# Patient Record
Sex: Female | Born: 1945
Health system: Southern US, Community
[De-identification: ages and names within clinical notes are randomized; demographics above are authoritative.]

## PROBLEM LIST (undated history)

## (undated) DIAGNOSIS — I2699 Other pulmonary embolism without acute cor pulmonale: Secondary | ICD-10-CM

## (undated) DIAGNOSIS — E785 Hyperlipidemia, unspecified: Secondary | ICD-10-CM

## (undated) DIAGNOSIS — T7840XA Allergy, unspecified, initial encounter: Secondary | ICD-10-CM

## (undated) DIAGNOSIS — D689 Coagulation defect, unspecified: Secondary | ICD-10-CM

## (undated) DIAGNOSIS — I10 Essential (primary) hypertension: Secondary | ICD-10-CM

## (undated) HISTORY — DX: Hyperlipidemia, unspecified: E78.5

## (undated) HISTORY — PX: ABDOMINAL HYSTERECTOMY: SHX81

## (undated) HISTORY — DX: Essential (primary) hypertension: I10

## (undated) HISTORY — DX: Other pulmonary embolism without acute cor pulmonale: I26.99

## (undated) HISTORY — DX: Allergy, unspecified, initial encounter: T78.40XA

## (undated) HISTORY — DX: Coagulation defect, unspecified: D68.9

---

## 2014-01-18 HISTORY — PX: BREAST BIOPSY: SHX20

## 2018-08-16 ENCOUNTER — Ambulatory Visit: Payer: Self-pay | Admitting: Family Medicine

## 2018-08-17 ENCOUNTER — Encounter: Payer: Self-pay | Admitting: Family Medicine

## 2018-08-17 ENCOUNTER — Other Ambulatory Visit: Payer: Self-pay

## 2018-08-17 ENCOUNTER — Ambulatory Visit (INDEPENDENT_AMBULATORY_CARE_PROVIDER_SITE_OTHER): Payer: Medicare Other | Admitting: Family Medicine

## 2018-08-17 VITALS — BP 145/88 | HR 95 | Temp 98.4°F | Ht 66.5 in | Wt 210.0 lb

## 2018-08-17 DIAGNOSIS — E782 Mixed hyperlipidemia: Secondary | ICD-10-CM | POA: Diagnosis not present

## 2018-08-17 DIAGNOSIS — Z0000 Encounter for general adult medical examination without abnormal findings: Secondary | ICD-10-CM

## 2018-08-17 DIAGNOSIS — F17200 Nicotine dependence, unspecified, uncomplicated: Secondary | ICD-10-CM

## 2018-08-17 DIAGNOSIS — K118 Other diseases of salivary glands: Secondary | ICD-10-CM

## 2018-08-17 DIAGNOSIS — E785 Hyperlipidemia, unspecified: Secondary | ICD-10-CM

## 2018-08-17 DIAGNOSIS — M720 Palmar fascial fibromatosis [Dupuytren]: Secondary | ICD-10-CM

## 2018-08-17 DIAGNOSIS — I10 Essential (primary) hypertension: Secondary | ICD-10-CM

## 2018-08-17 NOTE — Patient Instructions (Addendum)
It was a pleasure meeting you!  1. We took some blood for routine lab work. If anything abnormal returns, we will call you.  2. I have referred you to a hand surgeon for the dupuytren's contracture. They will call you to schedule an appointment.  3. Follow up in 1 month to recheck blood pressure.  Be Well!  Dr. Chauncey Reading Dupuytren's Contracture Dupuytren's contracture is a condition in which tissue under the skin of the palm becomes thick. This causes one or more of the fingers to curl inward (contract) toward the palm. After a while, the fingers may not be able to straighten out. This condition affects some or all of the fingers and the palm of the hand. This condition may affect one or both hands. Dupuytren's contracture is a long-term (chronic) condition that develops (progresses) slowly over time. There is no cure, but symptoms can be managed and progression can be slowed with treatment. This condition is usually not dangerous or painful, but it can interfere with everyday tasks. What are the causes?  This condition is caused by tissue (fascia) in the palm that gets thicker and tighter. When the fascia thickens, it pulls on the cords of tissue (tendons) that control finger movement. This causes the fingers to contract. The cause of fascia thickening is not known. However, the condition is often passed along from parent to child (inherited). What increases the risk? The following factors may make you more likely to develop this condition:  Being 69 years of age or older.  Being female.  Having a family history of this condition.  Using tobacco products, including cigarettes, chewing tobacco, and e-cigarettes.  Drinking alcohol excessively.  Having diabetes.  Having a seizure disorder. What are the signs or symptoms? Early symptoms of this condition may include:  Thick, puckered skin on the hand.  One or more lumps (nodules) on the palm. Nodules may be tender when they first  appear, but they are generally painless. Later symptoms of this condition may include:  Thick cords of tissue in the palm.  Fingers curled up toward the palm.  Inability to straighten the fingers into their normal position. Though this condition is usually painless, you may have discomfort when holding or grabbing objects. How is this diagnosed? This condition is diagnosed with a physical exam, which may include:  Looking at your hands and feeling your palms. This is to check for thickened fascia and nodules.  Measuring finger motion.  Doing the [place] tabletop test. You may be asked to try to put your hand on a surface, with your palm down and your fingers straight out. How is this treated? There is no cure for this condition, but treatment can relieve discomfort and make symptoms more manageable. Treatment options may include:  Physical therapy. This can strengthen your hand and increase flexibility.  Occupational therapy. This can help you with everyday tasks that may be more difficult because of your condition.  Shots (injections). Substances may be injected into your hand, such as: ? Medicines that help to decrease swelling (corticosteroids). ? Proteins (collagenase) to weaken thick tissue. After a collagenase injection, your health care provider may stretch your fingers.  Needle aponeurotomy. A needle is pushed through the skin and into the fascia. Moving the needle against the fascia can weaken or break up the thick tissue.  Surgery. This may be needed if your condition causes discomfort or interferes with everyday activities. Physical therapy is usually needed after surgery. No treatment is guaranteed to cure  this condition. Recurrence of symptoms is common. Follow these instructions at home: Hand care  Take these actions to help protect your hand from possible injury: ? Use tools that have padded grips. ? Wear protective gloves while you work with your hands. ? Avoid  repetitive hand movements. General instructions  Take over-the-counter and prescription medicines only as told by your health care provider.  Manage any other conditions that you have, such as diabetes.  If physical therapy was prescribed, do exercises as told by your health care provider.  Do not use any products that contain nicotine or tobacco, such as cigarettes, e-cigarettes, and chewing tobacco. If you need help quitting, ask your health care provider.  If you drink alcohol: ? Limit how much you use to:  0-1 drink a day for women.  0-2 drinks a day for men. ? Be aware of how much alcohol is in your drink. In the [place] one drink equals one 12 oz bottle of beer (355 mL), one 5 oz glass of wine (148 mL), or one 1 oz glass of hard liquor (44 mL).  Keep all follow-up visits as told by your health care provider. This is important. Contact a health care provider if:  You develop new symptoms, or your symptoms get worse.  You have pain that gets worse or does not get better with medicine.  You have difficulty or discomfort with everyday tasks.  You develop numbness or tingling. Get help right away if:  You have severe pain.  Your fingers change color or become unusually cold. Summary  Dupuytren's contracture is a condition in which tissue under the skin of the palm becomes thick.  This condition is caused by tissue (fascia) that thickens. When it thickens, it pulls on the cords of tissue (tendons) that control finger movement and makes the fingers to contract.  You are more likely to develop this condition if you are a man, are over 73 years of age, have a family history of the condition, and drink a lot of alcohol.  This condition can be treated with physical and occupational therapy, injections, and surgery.  Follow instructions about how to care for your hand. Get help right away if you have severe pain or your fingers change color or become cold. This information is  not intended to replace advice given to you by your health care provider. Make sure you discuss any questions you have with your health care provider. Document Released: 11/01/2008 Document Revised: 07/26/2017 Document Reviewed: 07/26/2017 Elsevier Patient Education  2020 [employer]

## 2018-08-17 NOTE — Progress Notes (Signed)
Subjective:    Patient ID: Sandra Hernandez, female    DOB: [birth year], 73 y.o.   MRN: [id number]   CC: here to establish care  HPI: Sandra Hernandez is a 73 yo woman w/ PMH of PE, HLD and dupuytren's contracture. She presents today to establish care. She moved to [place] recently to be with her fiance. She formerly lived in [place] and worked for [employer], but is now retired.   Pt currently smokes 1/3 of a pack of cigarettes per day. She has gone through various periods of quitting and starting and estimates that her total smoking history is about 10 years (3 pack-years). She is not yet ready to try quitting again.  Pt experienced a PE in the 1970s secondary to OCPs. She was treated in hospital with a blood thinner, followed by 6 mo of coumadin. She is now postmenopausal, takes no hormones, and has had a total hysterectomy.   Family hx is most significant for Sandra Hernandez in her 56s. No strong hx of HTN, DM, or heart disease. Pt had "toxaemia" or eclampsia, during 2 of her pregnancies, which resulted in stillbirth and miscarriage.  Since the pandemic began and pt moved to [facility] to be with her fiance, she has decreased exercise. Her most significant exercise is mowing the lawn once a week. Her fiance is a good cook and makes macaroni and meat products mostly. She has gained 15 lbs since moving to [place] and decreasing her weekly walking.    ROS: pertinent noted in the HPI    Past medical history, surgical, family, and social history reviewed and updated in the EMR as appropriate.  Objective:  BP (!) 145/88   Pulse 95   Temp 98.4 F (36.9 C) (Oral)   Ht 5' 6.5" (1.689 m)   Wt 210 lb (95.3 kg)   SpO2 99%   BMI 33.39 kg/m   Vitals and nursing note reviewed  General: NAD, pleasant, able to participate in exam HEENT: normocephalic, atraumatic, PERRLA, EOMI. Firm, smooth, bilateral submandibular nodules approx 1 cm in size Cardiac: RRR, S1 S2 present.  normal heart sounds, no murmurs. Respiratory: CTAB, normal effort, No wheezes, rales or rhonchi Extremities: no edema or cyanosis. Dupuyten's contracture grade 2 present along Left 5th metacarpal, grade 1 along Right 5th metacarpal Skin: warm and dry, healing excoriations and hyperpigmentation present along shoulder girdle Neuro: alert, no obvious focal deficits Psych: Normal affect and mood   Assessment & Plan:  [name] is a 73 yo woman w/ PMH of hyperlipidemia, PE and dupuytren's contracture.  HLD (hyperlipidemia) Pt has historically been on Simvastatin, unsure what dosage - Lipid panel collected today - Discussed exercise and diet habits. Pt was walking 3 miles about 3x per week and had lost 15 lbs. She believes she can begin this walking regimen again. - Follow up in 1 month unless results indicate an earlier return visit  Hypertension Pt 140s/80s in office today with repeat measurements - Pt treated for HTN in past but stopped medication due to resolution of elevated BP - History of eclampsia raises risk for HTN - BMP collected today to assess kidney function   Dupuytren contracture Stable, present for many years, L hand grade 2, R hand grade 1 - referral to hand surgery for evaluation and treatment - will check A1c at next visit due to correlation of Dupuytren's and diabetes  Tobacco use disorder Pt is aware of risks of smoking including heart problems, lung problems,  and cancer - She is not quite ready to quit - Educated patient that she is welcome to discuss quitting with me when she is ready  Other diseases of salivary glands - bilateral, symmetric, smoothly enlarged submandibular salivary glands present for many years - will monitor with physical exam    Shirlean Mylaraitlin Taiden [place]bourn, MD [facility] PGY-1

## 2018-08-18 ENCOUNTER — Encounter: Payer: Self-pay | Admitting: Family Medicine

## 2018-08-18 DIAGNOSIS — K118 Other diseases of salivary glands: Secondary | ICD-10-CM | POA: Insufficient documentation

## 2018-08-18 DIAGNOSIS — F17200 Nicotine dependence, unspecified, uncomplicated: Secondary | ICD-10-CM | POA: Insufficient documentation

## 2018-08-18 DIAGNOSIS — I10 Essential (primary) hypertension: Secondary | ICD-10-CM | POA: Insufficient documentation

## 2018-08-18 DIAGNOSIS — M720 Palmar fascial fibromatosis [Dupuytren]: Secondary | ICD-10-CM | POA: Insufficient documentation

## 2018-08-18 DIAGNOSIS — E785 Hyperlipidemia, unspecified: Secondary | ICD-10-CM | POA: Insufficient documentation

## 2018-08-18 LAB — BASIC METABOLIC PANEL
BUN/Creatinine Ratio: 8 — ABNORMAL LOW (ref 12–28)
BUN: 9 mg/dL (ref 8–27)
CO2: 21 mmol/L (ref 20–29)
Calcium: 9.9 mg/dL (ref 8.7–10.3)
Chloride: 103 mmol/L (ref 96–106)
Creatinine, Ser: 1.06 mg/dL — ABNORMAL HIGH (ref 0.57–1.00)
GFR calc Af Amer: 60 mL/min/{1.73_m2} (ref >59)
GFR calc non Af Amer: 52 mL/min/{1.73_m2} — ABNORMAL LOW (ref >59)
Glucose: 97 mg/dL (ref 65–99)
Potassium: 4.1 mmol/L (ref 3.5–5.2)
Sodium: 141 mmol/L (ref 134–144)

## 2018-08-18 LAB — CBC WITH DIFFERENTIAL/PLATELET
Basophils Absolute: 0 10*3/uL (ref 0.0–0.2)
Basos: 1 %
EOS (ABSOLUTE): 0.2 10*3/uL (ref 0.0–0.4)
Eos: 3 %
Hematocrit: 39.9 % (ref 34.0–46.6)
Hemoglobin: 13.7 g/dL (ref 11.1–15.9)
Immature Grans (Abs): 0 10*3/uL (ref 0.0–0.1)
Immature Granulocytes: 0 %
Lymphocytes Absolute: 2.3 10*3/uL (ref 0.7–3.1)
Lymphs: 40 %
MCH: 28.7 pg (ref 26.6–33.0)
MCHC: 34.3 g/dL (ref 31.5–35.7)
MCV: 84 fL (ref 79–97)
Monocytes Absolute: 0.4 10*3/uL (ref 0.1–0.9)
Monocytes: 6 %
Neutrophils Absolute: 2.9 10*3/uL (ref 1.4–7.0)
Neutrophils: 50 %
Platelets: 213 10*3/uL (ref 150–450)
RBC: 4.78 x10E6/uL (ref 3.77–5.28)
RDW: 13.6 % (ref 11.7–15.4)
WBC: 5.8 10*3/uL (ref 3.4–10.8)

## 2018-08-18 LAB — LIPID PANEL
Chol/HDL Ratio: 4.3 ratio (ref 0.0–4.4)
Cholesterol, Total: 204 mg/dL — ABNORMAL HIGH (ref 100–199)
HDL: 47 mg/dL (ref >39)
LDL Calculated: 132 mg/dL — ABNORMAL HIGH (ref 0–99)
Triglycerides: 124 mg/dL (ref 0–149)
VLDL Cholesterol Cal: 25 mg/dL (ref 5–40)

## 2018-08-18 NOTE — Assessment & Plan Note (Signed)
Pt is aware of risks of smoking including heart problems, lung problems, and cancer - She is not quite ready to quit - Educated patient that she is welcome to discuss quitting with me when she is ready

## 2018-08-18 NOTE — Assessment & Plan Note (Addendum)
Pt 140s/80s in office today with repeat measurements - Pt treated for HTN in past but stopped medication due to resolution of elevated BP - History of eclampsia raises risk for HTN - BMP collected today to assess kidney function

## 2018-08-18 NOTE — Assessment & Plan Note (Signed)
-   bilateral, symmetric, smoothly enlarged submandibular salivary glands present for many years - will monitor with physical exam

## 2018-08-18 NOTE — Assessment & Plan Note (Signed)
Stable, present for many years, L hand grade 2, R hand grade 1 - referral to hand surgery for evaluation and treatment - will check A1c at next visit due to correlation of Dupuytren's and diabetes

## 2018-08-18 NOTE — Assessment & Plan Note (Addendum)
Pt has historically been on Simvastatin, unsure what dosage - Lipid panel collected today - Discussed exercise and diet habits. Pt was walking 3 miles about 3x per week and had lost 15 lbs. She believes she can begin this walking regimen again. - Follow up in 1 month unless results indicate an earlier return visit

## 2018-09-27 ENCOUNTER — Telehealth: Payer: Self-pay

## 2018-09-27 DIAGNOSIS — L299 Pruritus, unspecified: Secondary | ICD-10-CM

## 2018-09-27 MED ORDER — HYDROXYZINE HCL 25 MG PO TABS: 25.0000 mg | ORAL_TABLET | Freq: Every evening | ORAL | 1 refills | Status: AC | PRN

## 2018-09-27 NOTE — Telephone Encounter (Signed)
Patient states that she has taken hydroxyzine for many years due to pruritus.  She has tried other medications such as first and second generation antihistamine blockers, without relief.  She only takes 1 at night as needed, she has never had any problems with becoming somnolent and falling.  I did warn her that this is possible to become a little sleepy and to take it only when she is in bed to avoid any falls.  Patient to follow-up on October 5 regarding high blood pressure, we only have one reading so far.  We will also discuss simvastatin and her LDL.  She states that she has not been regularly taking her simvastatin at night.  Gladys Damme, MD Family Medicine, PGY-1

## 2018-09-27 NOTE — Telephone Encounter (Signed)
Patient calls nurse line requesting a refill on hydroxyzine called into [facility]. I did not see this on med list. Please advise.

## 2018-10-23 ENCOUNTER — Ambulatory Visit: Payer: Medicare Other | Admitting: Family Medicine

## 2018-11-08 ENCOUNTER — Other Ambulatory Visit: Payer: Self-pay

## 2018-11-09 ENCOUNTER — Ambulatory Visit: Payer: Medicare Other | Admitting: Family Medicine

## 2018-11-09 ENCOUNTER — Other Ambulatory Visit: Payer: Self-pay

## 2018-11-15 ENCOUNTER — Encounter: Payer: Self-pay | Admitting: Family Medicine

## 2018-11-15 ENCOUNTER — Other Ambulatory Visit: Payer: Self-pay

## 2018-11-15 ENCOUNTER — Ambulatory Visit: Payer: Medicare Other | Admitting: Family Medicine

## 2018-11-15 VITALS — BP 128/74 | HR 72

## 2018-11-15 DIAGNOSIS — E785 Hyperlipidemia, unspecified: Secondary | ICD-10-CM | POA: Diagnosis not present

## 2018-11-15 DIAGNOSIS — Z23 Encounter for immunization: Secondary | ICD-10-CM

## 2018-11-15 DIAGNOSIS — L299 Pruritus, unspecified: Secondary | ICD-10-CM | POA: Diagnosis not present

## 2018-11-15 DIAGNOSIS — Z1159 Encounter for screening for other viral diseases: Secondary | ICD-10-CM | POA: Diagnosis not present

## 2018-11-15 DIAGNOSIS — I10 Essential (primary) hypertension: Secondary | ICD-10-CM

## 2018-11-15 MED ORDER — HYDROXYZINE HCL 25 MG PO TABS: 25.0000 mg | ORAL_TABLET | Freq: Every day | ORAL | 11 refills | Status: DC

## 2018-11-15 MED ORDER — SIMVASTATIN 10 MG PO TABS: 10.0000 mg | ORAL_TABLET | Freq: Every day | ORAL | 3 refills | Status: DC

## 2018-11-15 MED ORDER — BOOSTRIX 5-2.5-18.5 LF-MCG/0.5 IM SUSP: 0.5000 mL | Freq: Once | INTRAMUSCULAR | 0 refills | Status: AC

## 2018-11-15 NOTE — Patient Instructions (Addendum)
It was a pleasure seeing you today!  I have refilled your simvastatin and hydroxyzine today. We will recheck your cholesterol and kidney levels at your next visit in January 2021.   With hydroxyzine in people your age you must watch out for falls, and changes in cognition. Since you've used this for a long time and it's a low dose, this plan is acceptable for now.   Follow up in 3 months and we will check your lipid panel, liver function, and kidney function.  Be well!  Dr. Chauncey Reading

## 2018-11-15 NOTE — Assessment & Plan Note (Signed)
Refill simvastatin. Pt now regularly taking it. Lipid panel with elevated total cholesterol and LDL in July. Will recheck lipid panel and LFTs in 3 months at next visit.

## 2018-11-15 NOTE — Progress Notes (Signed)
   Subjective:    Patient ID: Sandra Hernandez, female    DOB: 11/20/1945, 73 y.o.   MRN: [id number]   CC: medication refill  HPI: Pt reports that she is overall feeling well and no concerns. She has been attempting to continue her walking regimen. She has been taking simvastatin every night without problem, no increased itching.  Pt inquires about creatinine level and concern for elevated number on laboratory work.   She believes she has been screened for Hep C in the past and will check her records for this.  Smoking status reviewed   ROS: pertinent noted in the HPI   Past medical history, surgical, family, and social history reviewed and updated in the EMR as appropriate.  Objective:  BP 128/74   Pulse 72   SpO2 98%   Vitals and nursing note reviewed  General: NAD, pleasant, able to participate in exam Cardiac: RRR, S1 S2 present. normal heart sounds, no murmurs. Respiratory: CTAB, normal effort, No wheezes, rales or rhonchi Extremities: no edema or cyanosis. Skin: warm and dry, no rashes noted Neuro: alert, no obvious focal deficits Psych: Normal affect and mood  Assessment & Plan:  Sandra Hernandez is a 73 yo woman with HLD, HTN, and pruritis presenting for medication refill.  HLD (hyperlipidemia) Refill simvastatin. Pt now regularly taking it. Lipid panel with elevated total cholesterol and LDL in July. Will recheck lipid panel and LFTs in 3 months at next visit.  Hypertension BP at goal today, does not require medication at this time. Will continue to follow. Pt concerned about cr of 1.06, discussed with her that this is within the range of acceptable increase for someone in her age demographic and we will continue to monitor over time.  - Follow up in 3 months and check BP and BMP for cr function  Pruritus Pt has longstanding history of pruritis. She has used low-dose hydroxyzine for many years, which helps her. Discussed the risk of using this drug in her age group  and concerns such as falling and dementia. Pt is aware of risks, due to low dose and pt's comfort with this particular drug will continue to prescribe for now. -check LFTs at next visit  Need for diphtheria-tetanus-pertussis (Tdap) vaccine Prescription sent to pharmacy.  Influenza vaccine administered Administered today.  Gladys Damme, MD [place] PGY-1

## 2018-11-16 ENCOUNTER — Encounter: Payer: Self-pay | Admitting: Family Medicine

## 2018-11-16 DIAGNOSIS — L299 Pruritus, unspecified: Secondary | ICD-10-CM | POA: Insufficient documentation

## 2018-11-16 DIAGNOSIS — Z23 Encounter for immunization: Secondary | ICD-10-CM | POA: Insufficient documentation

## 2018-11-16 NOTE — Assessment & Plan Note (Signed)
Administered today.

## 2018-11-16 NOTE — Assessment & Plan Note (Signed)
BP at goal today, does not require medication at this time. Will continue to follow. Pt concerned about cr of 1.06, discussed with her that this is within the range of acceptable increase for someone in her age demographic and we will continue to monitor over time.  - Follow up in 3 months and check BP and BMP for cr function

## 2018-11-16 NOTE — Assessment & Plan Note (Signed)
Pt has longstanding history of pruritis. She has used low-dose hydroxyzine for many years, which helps her. Discussed the risk of using this drug in her age group and concerns such as falling and dementia. Pt is aware of risks, due to low dose and pt's comfort with this particular drug will continue to prescribe for now. -check LFTs at next visit

## 2018-11-16 NOTE — Assessment & Plan Note (Signed)
Prescription sent to pharmacy.

## 2018-12-05 ENCOUNTER — Other Ambulatory Visit: Payer: Self-pay | Admitting: Family Medicine

## 2018-12-05 DIAGNOSIS — L299 Pruritus, unspecified: Secondary | ICD-10-CM

## 2019-01-25 ENCOUNTER — Other Ambulatory Visit: Payer: Self-pay

## 2019-01-25 ENCOUNTER — Ambulatory Visit (INDEPENDENT_AMBULATORY_CARE_PROVIDER_SITE_OTHER): Payer: Medicare PPO | Admitting: Family Medicine

## 2019-01-25 VITALS — BP 158/82 | HR 71 | Wt 218.4 lb

## 2019-01-25 DIAGNOSIS — H6122 Impacted cerumen, left ear: Secondary | ICD-10-CM | POA: Diagnosis not present

## 2019-01-25 NOTE — Patient Instructions (Signed)
Thank you for coming to see me today. It was a pleasure. Today we talked about:   Your ears: You had wax and cotton in your left ear.  We were able to remove it.  You can also use nasal saline spray to help with the balance of air and you are sinuses and eustachian tubes.  If you do not have improvement in the fullness in your ear in the next week, please come back.  Please follow-up with your PCP in the next month, she would like to see you back for a follow-up appointment.  If you have any questions or concerns, please do not hesitate to call the office at [phone].  Best,   Luis Abed, DO

## 2019-01-25 NOTE — Progress Notes (Signed)
     Subjective: Chief Complaint  Patient presents with  . Ear Problem     HPI: Sandra Hernandez is a 74 y.o. presenting to clinic today to discuss the following:  1 Left ear fullness Patient reports that last week she started to notice some itching in her left ear canal.  States that she put hydrogen peroxide on the end of the Q-tip and then placed this in her ear.  Notes that since then, she has been having a fullness in her ear and feels like she cannot hear as well out of that ear.  Denies any true pain, congestion, fevers.  Denies any drainage from the ear.  States that she has had to come in for wax cleanouts previously.      ROS noted in HPI. Chief complaint noted.  Other Pertinent PMH: Hypertension, hyperlipidemia Past Medical, Surgical, Social, and Family History Reviewed & Updated per EMR.      Social History   Tobacco Use  Smoking Status Current Every Day Smoker  . Packs/day: 0.33  . Years: 10.00  . Pack years: 3.30  . Types: Cigarettes  Smokeless Tobacco Never Used   Smoking status noted.    Objective: BP (!) 158/82   Pulse 71   Wt 218 lb 6.4 oz (99.1 kg)   SpO2 97%   BMI 34.72 kg/m  Vitals and nursing notes reviewed  Physical Exam:  General: 74 y.o. female in NAD HEENT: Right ear canal clear with TM intact, Left ear canal with large amount of wax with what appears to be white cotton attached to it, obscuring view of left tympanic membrane. Lungs: Breathing comfortably on room air Extremities: Ambulating without difficulty  Ear washout performed in clinic HEENT: Left ear canal clear after cleanout with tympanic membrane intact and non-erythematous  No results found for this or any previous visit (from the past 72 hour(s)).  Assessment/Plan:  Impacted cerumen of left ear Left ear with impacted cerumen, small amount of Q-tip in the ear.  Advised patient to no longer use Q-tips to clean her ears.  Advised to use a warm wet washcloth to clean the  outside of her ear and to come in for cleanings deeper inside.  Patient noted some mild improvement in her symptoms with the removal of this, but still felt a small amount of fullness.  Advised her to use nasal saline spray to help reset balance of her eustachian tube.  She voiced understanding of this.  Advised if no improvement within 1 week to return to care.     PATIENT EDUCATION PROVIDED: See AVS    Diagnosis and plan along with any newly prescribed medication(s) were discussed in detail with this patient today. The patient verbalized understanding and agreed with the plan. Patient advised if symptoms worsen return to clinic or ER.   Health Maintainance: PCP wanted to follow up with patient in 3 months from visit in October 2020, advised to make appointment with PCP.   No orders of the defined types were placed in this encounter.   No orders of the defined types were placed in this encounter.    Luis Abed, DO 01/26/2019, 10:15 AM PGY-2 [facility]

## 2019-01-26 DIAGNOSIS — H6122 Impacted cerumen, left ear: Secondary | ICD-10-CM | POA: Insufficient documentation

## 2019-01-26 NOTE — Assessment & Plan Note (Signed)
Left ear with impacted cerumen, small amount of Q-tip in the ear.  Advised patient to no longer use Q-tips to clean her ears.  Advised to use a warm wet washcloth to clean the outside of her ear and to come in for cleanings deeper inside.  Patient noted some mild improvement in her symptoms with the removal of this, but still felt a small amount of fullness.  Advised her to use nasal saline spray to help reset balance of her eustachian tube.  She voiced understanding of this.  Advised if no improvement within 1 week to return to care.

## 2019-09-05 ENCOUNTER — Encounter: Payer: Self-pay | Admitting: Family Medicine

## 2019-09-05 ENCOUNTER — Other Ambulatory Visit: Payer: Self-pay

## 2019-09-05 ENCOUNTER — Ambulatory Visit (INDEPENDENT_AMBULATORY_CARE_PROVIDER_SITE_OTHER): Payer: Medicare PPO | Admitting: Family Medicine

## 2019-09-05 VITALS — BP 140/74 | HR 73 | Ht 67.0 in | Wt 216.4 lb

## 2019-09-05 DIAGNOSIS — Z716 Tobacco abuse counseling: Secondary | ICD-10-CM | POA: Diagnosis not present

## 2019-09-05 DIAGNOSIS — L299 Pruritus, unspecified: Secondary | ICD-10-CM

## 2019-09-05 DIAGNOSIS — R7989 Other specified abnormal findings of blood chemistry: Secondary | ICD-10-CM | POA: Diagnosis not present

## 2019-09-05 DIAGNOSIS — E782 Mixed hyperlipidemia: Secondary | ICD-10-CM | POA: Diagnosis not present

## 2019-09-05 DIAGNOSIS — Z131 Encounter for screening for diabetes mellitus: Secondary | ICD-10-CM

## 2019-09-05 DIAGNOSIS — K118 Other diseases of salivary glands: Secondary | ICD-10-CM

## 2019-09-05 DIAGNOSIS — Z0001 Encounter for general adult medical examination with abnormal findings: Secondary | ICD-10-CM | POA: Diagnosis not present

## 2019-09-05 DIAGNOSIS — Z1231 Encounter for screening mammogram for malignant neoplasm of breast: Secondary | ICD-10-CM

## 2019-09-05 DIAGNOSIS — Z13820 Encounter for screening for osteoporosis: Secondary | ICD-10-CM | POA: Diagnosis not present

## 2019-09-05 DIAGNOSIS — Z122 Encounter for screening for malignant neoplasm of respiratory organs: Secondary | ICD-10-CM

## 2019-09-05 DIAGNOSIS — F17200 Nicotine dependence, unspecified, uncomplicated: Secondary | ICD-10-CM

## 2019-09-05 DIAGNOSIS — Z1159 Encounter for screening for other viral diseases: Secondary | ICD-10-CM

## 2019-09-05 DIAGNOSIS — I10 Essential (primary) hypertension: Secondary | ICD-10-CM | POA: Diagnosis not present

## 2019-09-05 DIAGNOSIS — Z0000 Encounter for general adult medical examination without abnormal findings: Secondary | ICD-10-CM | POA: Insufficient documentation

## 2019-09-05 LAB — POCT GLYCOSYLATED HEMOGLOBIN (HGB A1C): Hemoglobin A1C: 5 % (ref 4.0–5.6)

## 2019-09-05 MED ORDER — NICOTINE POLACRILEX 4 MG MT GUM: 4.0000 mg | CHEWING_GUM | OROMUCOSAL | 0 refills | Status: DC | PRN

## 2019-09-05 MED ORDER — HYDROXYZINE HCL 25 MG PO TABS: ORAL_TABLET | ORAL | 11 refills | Status: DC

## 2019-09-05 NOTE — Assessment & Plan Note (Signed)
Goal is <150/90, at goal as BMP 140/74. - Medications: none - Compliance: n/a - Checking BP at home: no - Denies any SOB, CP, vision changes, LE edema, medication SEs, or symptoms of hypotension - Diet: SAD - Exercise: decreased in the last year, recommend starting walking regimen or re-joining [employer]. - CMP obtained today to check scr

## 2019-09-05 NOTE — Assessment & Plan Note (Addendum)
Patient has smooth, enlarged glands, now R >L. Will consider [place] and discuss with preceptor, shared decision making with patient.

## 2019-09-05 NOTE — Patient Instructions (Addendum)
It was a pleasure to see you today! We discussed many things:  1. Preventive exams: call to schedule your mammogram (last one!) and DEXA (bone density scan). See the handout given to you.  2. For smoking: you may pick up the nicorette gum at the pharmacy later today or tomorrow. I also recommend the QUIT LINE resources: 1-800-QUIT-NOW  3. For lung cancer screening: it has been scheduled for you, the nurse will give you a card, please see the handout about information regarding the screening.  4. We are checking some routine blood work today, I will let you know about the results and your cholesterol medication in the next few days.   5. If anything else comes up, don't hesitate to reach out to our office- [phone]  6. Walking for exercise 3x per week for 30 minutes. Increasing fresh fruit and vegetables in your diet.  Be Well,  Dr. Leary Roca  Health Maintenance, Female Adopting a healthy lifestyle and getting preventive care are important in promoting health and wellness. Ask your health care provider about:  The right schedule for you to have regular tests and exams.  Things you can do on your own to prevent diseases and keep yourself healthy. What should I know about diet, weight, and exercise? Eat a healthy diet   Eat a diet that includes plenty of vegetables, fruits, low-fat dairy products, and lean protein.  Do not eat a lot of foods that are high in solid fats, added sugars, or sodium. Maintain a healthy weight Body mass index (BMI) is used to identify weight problems. It estimates body fat based on height and weight. Your health care provider can help determine your BMI and help you achieve or maintain a healthy weight. Get regular exercise Get regular exercise. This is one of the most important things you can do for your health. Most adults should:  Exercise for at least 150 minutes each week. The exercise should increase your heart rate and make you sweat  (moderate-intensity exercise).  Do strengthening exercises at least twice a week. This is in addition to the moderate-intensity exercise.  Spend less time sitting. Even light physical activity can be beneficial. Watch cholesterol and blood lipids Have your blood tested for lipids and cholesterol at 74 years of age, then have this test every 5 years. Have your cholesterol levels checked more often if:  Your lipid or cholesterol levels are high.  You are older than 74 years of age.  You are at high risk for heart disease. What should I know about cancer screening? Depending on your health history and family history, you may need to have cancer screening at various ages. This may include screening for:  Breast cancer.  Cervical cancer.  Colorectal cancer.  Skin cancer.  Lung cancer. What should I know about heart disease, diabetes, and high blood pressure? Blood pressure and heart disease  High blood pressure causes heart disease and increases the risk of stroke. This is more likely to develop in people who have high blood pressure readings, are of African descent, or are overweight.  Have your blood pressure checked: ? Every 3-5 years if you are 5-35 years of age. ? Every year if you are 46 years old or older. Diabetes Have regular diabetes screenings. This checks your fasting blood sugar level. Have the screening done:  Once every three years after age 58 if you are at a normal weight and have a low risk for diabetes.  More often and at  a younger age if you are overweight or have a high risk for diabetes. What should I know about preventing infection? Hepatitis B If you have a higher risk for hepatitis B, you should be screened for this virus. Talk with your health care provider to find out if you are at risk for hepatitis B infection. Hepatitis C Testing is recommended for:  Everyone born from [birth year] through 1965.  Anyone with known risk factors for hepatitis  C. Sexually transmitted infections (STIs)  Get screened for STIs, including gonorrhea and chlamydia, if: ? You are sexually active and are younger than 74 years of age. ? You are older than 74 years of age and your health care provider tells you that you are at risk for this type of infection. ? Your sexual activity has changed since you were last screened, and you are at increased risk for chlamydia or gonorrhea. Ask your health care provider if you are at risk.  Ask your health care provider about whether you are at high risk for HIV. Your health care provider may recommend a prescription medicine to help prevent HIV infection. If you choose to take medicine to prevent HIV, you should first get tested for HIV. You should then be tested every 3 months for as long as you are taking the medicine. Pregnancy  If you are about to stop having your period (premenopausal) and you may become pregnant, seek counseling before you get pregnant.  Take 400 to 800 micrograms (mcg) of folic acid every day if you become pregnant.  Ask for birth control (contraception) if you want to prevent pregnancy. Osteoporosis and menopause Osteoporosis is a disease in which the bones lose minerals and strength with aging. This can result in bone fractures. If you are 23 years old or older, or if you are at risk for osteoporosis and fractures, ask your health care provider if you should:  Be screened for bone loss.  Take a calcium or vitamin D supplement to lower your risk of fractures.  Be given hormone replacement therapy (HRT) to treat symptoms of menopause. Follow these instructions at home: Lifestyle  Do not use any products that contain nicotine or tobacco, such as cigarettes, e-cigarettes, and chewing tobacco. If you need help quitting, ask your health care provider.  Do not use street drugs.  Do not share needles.  Ask your health care provider for help if you need support or information about quitting  drugs. Alcohol use  Do not drink alcohol if: ? Your health care provider tells you not to drink. ? You are pregnant, may be pregnant, or are planning to become pregnant.  If you drink alcohol: ? Limit how much you use to 0-1 drink a day. ? Limit intake if you are breastfeeding.  Be aware of how much alcohol is in your drink. In the [place] one drink equals one 12 oz bottle of beer (355 mL), one 5 oz glass of wine (148 mL), or one 1 oz glass of hard liquor (44 mL). General instructions  Schedule regular health, dental, and eye exams.  Stay current with your vaccines.  Tell your health care provider if: ? You often feel depressed. ? You have ever been abused or do not feel safe at home. Summary  Adopting a healthy lifestyle and getting preventive care are important in promoting health and wellness.  Follow your health care provider's instructions about healthy diet, exercising, and getting tested or screened for diseases.  Follow your health care provider's  instructions on monitoring your cholesterol and blood pressure. This information is not intended to replace advice given to you by your health care provider. Make sure you discuss any questions you have with your health care provider. Document Revised: 12/28/2017 Document Reviewed: 12/28/2017 Elsevier Patient Education  2020 [employer].

## 2019-09-05 NOTE — Assessment & Plan Note (Signed)
Discussed risks and contraindications in her age group for this medication. Patient reports understanding, still wants medication.  - CMP to evaluate LFTs

## 2019-09-05 NOTE — Progress Notes (Signed)
SUBJECTIVE:   CHIEF COMPLAINT / HPI: annual exam  1. HTN: patient below goal of <150/90 for age range, today 140/74. Will check kidney function and electrolytes.  2. Smoking: current smoker, has not smoked for 2 days, usually smokes 10 cigarettes per day, has been smoking since age 74 yo. Reports has taken cumulative of 2 years off of smoking-- previously quit when she had PE due to hormonal contraception several years ago and was on warfarin. >25 pack-year history. Interested in quitting today, wants nicotine gum. Discussed risks/benefits of lung cancer screening with LDCT. Patient is interested in screening.   3. Breast cancer screening: last mammogram 2 years ago, reports history of normal screenings. Due for last screening this year.  4. Colonoscopy: last done in 2019, 1 polyp, recommended 10 year follow up, which she will have aged out of screening at 74 yo, can consider FIT test or cologuard at that point, or refer to GI depending on shared decision making.  5. Bone density: patient has protective factors of obesity, however is a current smoker, recommend getting DEXA.  6. Itching: patient still has itching at night time, would like hydroxyzine refilled. Discussed risk factors of using hydroxyzine, including increase of falls, BEERS list, etc. Patient reports no problems using the medication, no falls, no loss of balance, expresses understanding of risks.  7. Pap smears: patient is outside of screening age. Reports history of normal pap smears, has not been sexually active in over a year, denies h/o STIs, denies dysuria, discharge, or other gynecologic/urologic complaint.   8. HLD: check lipid panel today and LFTs.  9. Obesity: discussed healthy eating, weight loss, and exercise. Patient admits she has not eaten as healthy or done as much exercise as she used to. Is interested in changing diet/exercise on her own.  PERTINENT  PMH / PSH: pruritus, HTN, HLD, enlarged salivary  gland  OBJECTIVE:   BP 140/74   Pulse 73   Ht 5\' 7"  (1.702 m)   Wt 216 lb 6 oz (98.1 kg)   SpO2 97%   BMI 33.89 kg/m   Physical Exam Vitals and nursing note reviewed.  Constitutional:      General: She is not in acute distress.    Appearance: Normal appearance. She is obese. She is not ill-appearing, toxic-appearing or diaphoretic.  HENT:     Head: Normocephalic and atraumatic.  Neck:     Comments: Stable enlarged salivary gland 1 cm, soft, mobile Cardiovascular:     Rate and Rhythm: Normal rate and regular rhythm.     Pulses: Normal pulses.     Heart sounds: Normal heart sounds. No murmur heard.  No friction rub. No gallop.   Pulmonary:     Effort: Pulmonary effort is normal. No respiratory distress.     Breath sounds: Normal breath sounds. No wheezing, rhonchi or rales.  Abdominal:     General: Bowel sounds are normal.  Musculoskeletal:     Right lower leg: No edema.     Left lower leg: No edema.  Lymphadenopathy:     Cervical: No cervical adenopathy.  Skin:    General: Skin is warm and dry.  Neurological:     General: No focal deficit present.     Mental Status: She is alert. Mental status is at baseline.  Psychiatric:        Mood and Affect: Mood normal.        Behavior: Behavior normal.    ASSESSMENT/PLAN:   Tobacco use disorder  Patient has long smoking history (74 yo to present), but stopped for several years (max 2-3 years not smoking). On average she smoked 1/2 pack per day x50 years = 25 pack years. She has not smoked a cigarette in 2 days and wishes to stop smoking, is interested in nicotine gum. Discussed LD CT screening for lung cancer risks and benefits, patient wants to proceed with screening. - low dose CT for lung cancer screening - nicotine gum 2mg  rx sent to pharmacy  Other diseases of salivary glands Patient has smooth, enlarged glands, now R >L. Will consider .   HLD (hyperlipidemia) Lipid panel and CMP obtained today to evaluate  cholesterol and LFTs.  Hypertension Goal is <150/90, at goal as BMP 140/74. - Medications: none - Compliance: n/a - Checking BP at home: no - Denies any SOB, CP, vision changes, LE edema, medication SEs, or symptoms of hypotension - Diet: SAD - Exercise: decreased in the last year, recommend starting walking regimen or re-joining [employer]. - CMP obtained today to check scr  Pruritus Discussed risks and contraindications in her age group for this medication. Patient reports understanding, still wants medication.  - CMP to evaluate LFTs  Healthcare maintenance - hep c screening ordered today - DEXA ordered due to active smoking status, age - mammogram ordered: reports normal previous exams, last one in 2019; due for last exam this year. - colonoscopy: reports last in 2019, 1 polyp, recommended 10 year f/u, but patient will have aged out at that point, recommend following up for any symptoms such as hematochezia - completed COVID-19 immunization in the spring - reports normal history of pap smears, not currently sexually active, no dysuria/discharge, no history of STIs     08-09-1990, MD [facility]

## 2019-09-05 NOTE — Assessment & Plan Note (Signed)
-   hep c screening ordered today - DEXA ordered due to active smoking status, age - mammogram ordered: reports normal previous exams, last one in 2019; due for last exam this year. - colonoscopy: reports last in 2019, 1 polyp, recommended 10 year f/u, but patient will have aged out at that point, recommend following up for any symptoms such as hematochezia - completed COVID-19 immunization in the spring - reports normal history of pap smears, not currently sexually active, no dysuria/discharge, no history of STIs

## 2019-09-05 NOTE — Assessment & Plan Note (Signed)
Patient has long smoking history (74 yo to present), but stopped for several years (max 2-3 years not smoking). On average she smoked 1/2 pack per day x50 years = 25 pack years. She has not smoked a cigarette in 2 days and wishes to stop smoking, is interested in nicotine gum. Discussed LD CT screening for lung cancer risks and benefits, patient wants to proceed with screening. - low dose CT for lung cancer screening - nicotine gum 2mg  rx sent to pharmacy

## 2019-09-05 NOTE — Assessment & Plan Note (Signed)
Lipid panel and CMP obtained today to evaluate cholesterol and LFTs.

## 2019-09-06 LAB — LIPID PANEL
Chol/HDL Ratio: 5.5 ratio — ABNORMAL HIGH (ref 0.0–4.4)
Cholesterol, Total: 218 mg/dL — ABNORMAL HIGH (ref 100–199)
HDL: 40 mg/dL (ref >39)
LDL Chol Calc (NIH): 147 mg/dL — ABNORMAL HIGH (ref 0–99)
Triglycerides: 168 mg/dL — ABNORMAL HIGH (ref 0–149)
VLDL Cholesterol Cal: 31 mg/dL (ref 5–40)

## 2019-09-06 LAB — COMPREHENSIVE METABOLIC PANEL
ALT: 12 IU/L (ref 0–32)
AST: 15 IU/L (ref 0–40)
Albumin/Globulin Ratio: 1.6 (ref 1.2–2.2)
Albumin: 4.4 g/dL (ref 3.7–4.7)
Alkaline Phosphatase: 121 IU/L (ref 48–121)
BUN/Creatinine Ratio: 7 — ABNORMAL LOW (ref 12–28)
BUN: 8 mg/dL (ref 8–27)
Bilirubin Total: 0.4 mg/dL (ref 0.0–1.2)
CO2: 22 mmol/L (ref 20–29)
Calcium: 9.6 mg/dL (ref 8.7–10.3)
Chloride: 107 mmol/L — ABNORMAL HIGH (ref 96–106)
Creatinine, Ser: 1.1 mg/dL — ABNORMAL HIGH (ref 0.57–1.00)
GFR calc Af Amer: 57 mL/min/{1.73_m2} — ABNORMAL LOW (ref >59)
GFR calc non Af Amer: 50 mL/min/{1.73_m2} — ABNORMAL LOW (ref >59)
Globulin, Total: 2.7 g/dL (ref 1.5–4.5)
Glucose: 92 mg/dL (ref 65–99)
Potassium: 3.9 mmol/L (ref 3.5–5.2)
Sodium: 142 mmol/L (ref 134–144)
Total Protein: 7.1 g/dL (ref 6.0–8.5)

## 2019-09-14 ENCOUNTER — Ambulatory Visit (HOSPITAL_COMMUNITY)
Admission: RE | Admit: 2019-09-14 | Discharge: 2019-09-14 | Disposition: A | Discharge status: Home / Self Care | Payer: Medicare PPO | Source: Ambulatory Visit | Attending: Family Medicine | Admitting: Family Medicine

## 2019-09-14 ENCOUNTER — Other Ambulatory Visit: Payer: Self-pay

## 2019-09-14 DIAGNOSIS — F17210 Nicotine dependence, cigarettes, uncomplicated: Secondary | ICD-10-CM | POA: Insufficient documentation

## 2019-09-14 DIAGNOSIS — Z122 Encounter for screening for malignant neoplasm of respiratory organs: Secondary | ICD-10-CM | POA: Insufficient documentation

## 2019-09-26 ENCOUNTER — Telehealth: Payer: Self-pay | Admitting: Family Medicine

## 2019-09-26 NOTE — Telephone Encounter (Signed)
Discussed results of LDCT chest and that she should have another screening in 1 year, likely COPD. Recommended to quit smoking, patient is in contemplation stage, so will continue to discuss. She will follow up re: DEXA, mammogram, and evaluation of salivary glands.  Shirlean Mylar, MD [facility], PGY-2

## 2019-11-19 ENCOUNTER — Other Ambulatory Visit: Payer: Self-pay | Admitting: Family Medicine

## 2019-11-19 DIAGNOSIS — L299 Pruritus, unspecified: Secondary | ICD-10-CM

## 2019-12-14 ENCOUNTER — Other Ambulatory Visit: Payer: Self-pay | Admitting: Family Medicine

## 2019-12-14 DIAGNOSIS — E785 Hyperlipidemia, unspecified: Secondary | ICD-10-CM

## 2019-12-17 ENCOUNTER — Ambulatory Visit
Admission: RE | Admit: 2019-12-17 | Discharge: 2019-12-17 | Disposition: A | Discharge status: Home / Self Care | Payer: Medicare PPO | Source: Ambulatory Visit | Attending: Family Medicine | Admitting: Family Medicine

## 2019-12-17 ENCOUNTER — Other Ambulatory Visit: Payer: Self-pay

## 2019-12-17 DIAGNOSIS — Z13820 Encounter for screening for osteoporosis: Secondary | ICD-10-CM

## 2019-12-17 DIAGNOSIS — Z780 Asymptomatic menopausal state: Secondary | ICD-10-CM | POA: Diagnosis not present

## 2019-12-17 DIAGNOSIS — Z1231 Encounter for screening mammogram for malignant neoplasm of breast: Secondary | ICD-10-CM | POA: Diagnosis not present

## 2020-06-02 ENCOUNTER — Ambulatory Visit (INDEPENDENT_AMBULATORY_CARE_PROVIDER_SITE_OTHER): Payer: Medicare PPO | Admitting: Family Medicine

## 2020-06-02 ENCOUNTER — Other Ambulatory Visit: Payer: Self-pay

## 2020-06-02 VITALS — BP 123/70 | HR 72

## 2020-06-02 DIAGNOSIS — J0110 Acute frontal sinusitis, unspecified: Secondary | ICD-10-CM | POA: Diagnosis not present

## 2020-06-02 MED ORDER — AMOXICILLIN 500 MG PO CAPS: 1000.0000 mg | ORAL_CAPSULE | Freq: Three times a day (TID) | ORAL | 0 refills | Status: AC

## 2020-06-02 NOTE — Progress Notes (Signed)
    SUBJECTIVE:   CHIEF COMPLAINT / HPI:   Sandra Hernandez is a 75 yo F who presents for the following:  Headache/congestion Started last Wednesday. Has associated yellow/green mucus production. Has hx of sinus infections and amoxicillin helped in the past. Denies fever, shortness of breath or GI symptoms. Has tried several decongestants without relief.   PERTINENT  PMH / PSH: Non-contributory  OBJECTIVE:   BP 123/70   Pulse 72   SpO2 100%   General: Appears to be unwell, no acute distress. Age appropriate. HEENT: Normocephalic. Patent nares. TTP over frontal sinuses. Cardiac: RRR, normal heart sounds, no murmurs Respiratory: CTAB, normal effort  ASSESSMENT/PLAN:   Acute frontal sinusitis Day 6 of illness. Discussed supportive care measures such as hydration, rest, tylenol for headache. Can continue decongestants but would stop if not helping. Discussed with patient if symptoms continue into day 7-10 can fill rx for amoxicillin.  -Supportive care measures -Fill abx if not better in 2-4 days -F/u if symptoms fail to improve with tx     Lavonda Jumbo, DO [facility]

## 2020-06-02 NOTE — Patient Instructions (Signed)
Continue to drink plenty of fluids and rest. You can continue tylenol for headache.   If symptoms continue pick up antibiotic 06/04/20 from pharmacy.   Please call the clinic at [phone] if your symptoms after treatment if needed. It was our pleasure to serve you.  Dr. Salvadore Dom

## 2020-06-05 DIAGNOSIS — J0110 Acute frontal sinusitis, unspecified: Secondary | ICD-10-CM | POA: Insufficient documentation

## 2020-06-05 NOTE — Assessment & Plan Note (Addendum)
Day 6 of illness. Discussed supportive care measures such as hydration, rest, tylenol for headache. Can continue decongestants but would stop if not helping. Discussed with patient if symptoms continue into day 7-10 can fill rx for amoxicillin.  -Supportive care measures -Fill abx if not better in 2-4 days -F/u if symptoms fail to improve with tx

## 2020-09-09 ENCOUNTER — Other Ambulatory Visit: Payer: Self-pay | Admitting: Family Medicine

## 2020-09-09 DIAGNOSIS — L299 Pruritus, unspecified: Secondary | ICD-10-CM

## 2020-11-09 ENCOUNTER — Other Ambulatory Visit: Payer: Self-pay | Admitting: Family Medicine

## 2020-11-09 DIAGNOSIS — L299 Pruritus, unspecified: Secondary | ICD-10-CM

## 2020-11-13 ENCOUNTER — Other Ambulatory Visit: Payer: Self-pay | Admitting: Family Medicine

## 2020-11-13 DIAGNOSIS — Z1231 Encounter for screening mammogram for malignant neoplasm of breast: Secondary | ICD-10-CM

## 2020-12-06 ENCOUNTER — Other Ambulatory Visit: Payer: Self-pay | Admitting: Family Medicine

## 2020-12-06 DIAGNOSIS — L299 Pruritus, unspecified: Secondary | ICD-10-CM

## 2020-12-19 ENCOUNTER — Ambulatory Visit
Admission: RE | Admit: 2020-12-19 | Discharge: 2020-12-19 | Disposition: A | Discharge status: Home / Self Care | Payer: Medicare PPO | Source: Ambulatory Visit | Attending: Family Medicine | Admitting: Family Medicine

## 2020-12-19 DIAGNOSIS — Z1231 Encounter for screening mammogram for malignant neoplasm of breast: Secondary | ICD-10-CM

## 2021-03-03 ENCOUNTER — Other Ambulatory Visit: Payer: Self-pay | Admitting: Family Medicine

## 2021-03-03 DIAGNOSIS — L299 Pruritus, unspecified: Secondary | ICD-10-CM

## 2021-04-03 ENCOUNTER — Other Ambulatory Visit: Payer: Self-pay | Admitting: Family Medicine

## 2021-04-03 DIAGNOSIS — L299 Pruritus, unspecified: Secondary | ICD-10-CM

## 2021-05-02 ENCOUNTER — Other Ambulatory Visit: Payer: Self-pay | Admitting: Family Medicine

## 2021-05-02 DIAGNOSIS — L299 Pruritus, unspecified: Secondary | ICD-10-CM

## 2021-05-13 ENCOUNTER — Ambulatory Visit: Payer: Medicare PPO | Admitting: Family Medicine

## 2021-05-13 ENCOUNTER — Encounter: Payer: Self-pay | Admitting: Family Medicine

## 2021-05-13 VITALS — BP 162/92 | HR 79 | Ht 67.0 in | Wt 194.5 lb

## 2021-05-13 DIAGNOSIS — Z0000 Encounter for general adult medical examination without abnormal findings: Secondary | ICD-10-CM | POA: Diagnosis not present

## 2021-05-13 DIAGNOSIS — L299 Pruritus, unspecified: Secondary | ICD-10-CM | POA: Diagnosis not present

## 2021-05-13 DIAGNOSIS — Z1211 Encounter for screening for malignant neoplasm of colon: Secondary | ICD-10-CM | POA: Diagnosis not present

## 2021-05-13 DIAGNOSIS — Z0001 Encounter for general adult medical examination with abnormal findings: Secondary | ICD-10-CM

## 2021-05-13 DIAGNOSIS — Z1159 Encounter for screening for other viral diseases: Secondary | ICD-10-CM

## 2021-05-13 DIAGNOSIS — F17200 Nicotine dependence, unspecified, uncomplicated: Secondary | ICD-10-CM | POA: Diagnosis not present

## 2021-05-13 DIAGNOSIS — Z13220 Encounter for screening for lipoid disorders: Secondary | ICD-10-CM | POA: Diagnosis not present

## 2021-05-13 DIAGNOSIS — Z131 Encounter for screening for diabetes mellitus: Secondary | ICD-10-CM

## 2021-05-13 DIAGNOSIS — I10 Essential (primary) hypertension: Secondary | ICD-10-CM

## 2021-05-13 DIAGNOSIS — E782 Mixed hyperlipidemia: Secondary | ICD-10-CM

## 2021-05-13 DIAGNOSIS — Z122 Encounter for screening for malignant neoplasm of respiratory organs: Secondary | ICD-10-CM

## 2021-05-13 DIAGNOSIS — K118 Other diseases of salivary glands: Secondary | ICD-10-CM

## 2021-05-13 MED ORDER — AMLODIPINE BESYLATE 5 MG PO TABS: 5.0000 mg | ORAL_TABLET | Freq: Every day | ORAL | 3 refills | Status: AC

## 2021-05-13 NOTE — Assessment & Plan Note (Signed)
Check lipid panel, metabolic panel ?

## 2021-05-13 NOTE — Assessment & Plan Note (Signed)
Chronic, not controlled today. Start amlodipine 5 mg daily. Return in 2 weeks for check. ?

## 2021-05-13 NOTE — Assessment & Plan Note (Signed)
Patient has pruritus, uses hydroxyzine. Discussed BEERS criteria and that this is not recommended. Patient reports she cannot sleep due to itching. She has never had dizziness, falls, or confusion, reports no memory issues. Recommend not using the medication, overall likely not high risk. ?

## 2021-05-13 NOTE — Assessment & Plan Note (Signed)
No change in physical exam. Patient more interested in pursuing other health care matters. Recommend [place] if any changes. Will see if visible on low dose chest CT for lung cancer screening. ?

## 2021-05-13 NOTE — Patient Instructions (Addendum)
It was a pleasure to see you today! ? ?We will get some labs today.  If they are abnormal or we need to do something about them, I will call you.  If they are normal, I will send you a message on MyChart (if it is active) or a letter in the mail.  If you don't hear from [place] in 2 weeks, please call the office  [phone]. ?I am referring you for a CT of the chest to screen for lung cancer. You will get a call to schedule this appointment. If you have any trouble, please let me know [phone]. ?Get the sample for the cologuard and bring back in the next two weeks. This will let me know your risk of colon cancer. If low, you do not need any more screenings. If the risk comes back as high, I recommend a repeat colonoscopy. ?If you are ever ready to quit cigarettes, let me know and you can get support and supplies from 1-800-Quit-Now ?You no longer have to do mammograms ?Start taking amlodipine 5 mg once a day for blood pressure. Measure your blood pressure at home with a cuff above the elbow. Do it at the same time every day for a week and write down the numbers. Do it when you are most relaxed and with both feet flat on the ground and your arm at heart level ? ? ? ?Be Well, ? ?Dr. Leary Roca ? ?

## 2021-05-13 NOTE — Assessment & Plan Note (Signed)
Precontemplative. Will get lung cancer screening. ?

## 2021-05-13 NOTE — Assessment & Plan Note (Signed)
Last mammogram normal in December 2022, no more screening required. Pap smears normal, last one 10 years ago in [place] by report.  ? ?Patient had a colonoscopy in 2018 with reported polyps, this was in [facility]. Patient due for repeat this year and last screening. She prefers not to do a colonoscopy, but is interested in screening. After shared decision making, plan for cologuard and if high risk, will recommend colonoscopy. If low risk, can stop screening. ?

## 2021-05-13 NOTE — Progress Notes (Signed)
? ? ?SUBJECTIVE:  ? ?Chief compliant/HPI: annual examination ? ?Sandra Hernandez is a 76 y.o. who presents today for an annual exam.  ? ?Hypertension: BP not at goal: 160s/90s on two BP readings 30 minutes apart. No symptoms. Not on medications.  ? ?HLD: on simvastatin 20 mg. Will check LDL and CMP today. ? ?Tobacco use: smoking 5-10 cigarettes per day. She is precontemplative about quitting, trying to cut down use, not interested in medications. She reports that she has been smoking on and off for many years, since teen. She was a pack a day smoker, most likely over 20 pack year history. She had lung cancer screening in 2021 without any concerning findings. ? 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 She has had some "scans" done that show lung scarring. She is following up with a physician there to qualify for compensation. She denies any coughing, weight loss, hemoptysis. ? ?Salivary glands: patient has enlarged, smooth, bilateral salivary glands that have not changed in 3 years. She is not particularly interested in work up, reassuring that no change in size and bilaterally. Will obtain low dose CT for lung cancer, can consider [place] if not visible on chest CT. Will definitely recommend work up if any changes.  ? ?History tabs reviewed and updated.  ? ?Review of systems form reviewed and not notable.  ? ?OBJECTIVE:  ? ?BP (!) 162/92   Pulse 79   Ht $R'5\' 7"'bR$  (1.702 m)   Wt 194 lb 8 oz (88.2 kg)   SpO2 97%   BMI 30.46 kg/m?   ?Nursing note and vitals reviewed ?GEN: age-appropriate, AAW resting comfortably in chair, NAD, WNWD ?HEENT: NCAT. PERRLA. Sclera without injection or icterus. MMM. Smooth, symetrically enlarged salivary glands b/l. Clear oropharynx.  ?Neck: Supple. No LAD. ?Cardiac: Regular rate and rhythm. Normal S1/S2. No murmurs, rubs, or gallops appreciated. 2+ radial pulses. ?Lungs: Clear bilaterally to ascultation. No  increased WOB, no accessory muscle usage. No w/r/r. ?Neuro: AOx3  ?Ext: no edema ?Psych: Pleasant and appropriate  ? ?ASSESSMENT/PLAN:  ? ?Tobacco use disorder ?Precontemplative. Will get lung cancer screening. ? ?Pruritus ?Patient has pruritus, uses hydroxyzine. Discussed BEERS criteria and that this is not recommended. Patient reports she cannot sleep due to itching. She has never had dizziness, falls, or confusion, reports no memory issues. Recommend not using the medication, overall likely not high risk. ? ?Other diseases of salivary glands ?No change in physical exam. Patient more interested in pursuing other health care matters. Recommend [place] if any changes. Will see if visible on low dose chest CT for lung cancer screening. ? ?Hypertension ?Chronic, not controlled today. Start amlodipine 5 mg daily. Return in 2 weeks for check. ? ?HLD (hyperlipidemia) ?Check lipid panel, metabolic panel ? ?Healthcare maintenance ?Last mammogram normal in December 2022, no more screening required. Pap smears normal, last one 10 years ago in [place] by report.  ? ?Patient had a colonoscopy in 2018 with reported polyps, this was in [facility]. Patient due for repeat this year and last screening. She prefers not to do a colonoscopy, but is interested in screening. After shared decision making, plan for cologuard and if high risk, will recommend colonoscopy. If low risk, can stop screening. ?  ? ?  05/13/2021  ?  2:20 PM 09/05/2019  ?  8:26 AM 01/25/2019  ? 10:07 AM  ?PHQ9 SCORE ONLY  ?PHQ-9 Total Score 0 0 0  ? ? ?Annual  Examination  ?See AVS for age appropriate recommendations  ?PHQ score 0, reviewed and discussed.  ?BP reviewed and not at goal, see above.  ?Asked about intimate partner violence and resources given as appropriate  ? ?Considered the following items based upon USPSTF recommendations: ?Diabetes screening: discussed and ordered ?Screening for elevated cholesterol: discussed and ordered ? ?Osteoporosis screening  considered based upon risk of fracture from [facility] calculator. Major osteoporotic fracture risk is 6.9%. DEXA ordered for November. Last DEXA WNL in November 2021. ?Reviewed risk factors for latent tuberculosis and not indicated ? ?Discussed family history, BRCA testing not indicated.  ?Cervical cancer screening:  not indicated due to age. ?Breast cancer screening:  last mammogram in December 2022 was normal, now 76 yo, no longer in screening age.  ?Colorectal cancer screening:  ?Lung cancer screening: discussed and ordered.  ?Vaccinations.  ? ?Follow up in 1 year or sooner if indicated.  ? ? ?Gladys Damme, MD ?[place]  ? ?

## 2021-05-14 ENCOUNTER — Other Ambulatory Visit: Payer: Self-pay | Admitting: Family Medicine

## 2021-05-14 DIAGNOSIS — L299 Pruritus, unspecified: Secondary | ICD-10-CM

## 2021-05-14 LAB — COMPREHENSIVE METABOLIC PANEL
ALT: 11 IU/L (ref 0–32)
AST: 14 IU/L (ref 0–40)
Albumin/Globulin Ratio: 1.4 (ref 1.2–2.2)
Albumin: 4 g/dL (ref 3.7–4.7)
Alkaline Phosphatase: 106 IU/L (ref 44–121)
BUN/Creatinine Ratio: 7 — ABNORMAL LOW (ref 12–28)
BUN: 7 mg/dL — ABNORMAL LOW (ref 8–27)
Bilirubin Total: 0.3 mg/dL (ref 0.0–1.2)
CO2: 21 mmol/L (ref 20–29)
Calcium: 9.4 mg/dL (ref 8.7–10.3)
Chloride: 104 mmol/L (ref 96–106)
Creatinine, Ser: 1.02 mg/dL — ABNORMAL HIGH (ref 0.57–1.00)
Globulin, Total: 2.9 g/dL (ref 1.5–4.5)
Glucose: 84 mg/dL (ref 70–99)
Potassium: 3.9 mmol/L (ref 3.5–5.2)
Sodium: 139 mmol/L (ref 134–144)
Total Protein: 6.9 g/dL (ref 6.0–8.5)
eGFR: 57 mL/min/{1.73_m2} — ABNORMAL LOW (ref >59)

## 2021-05-14 LAB — HCV INTERPRETATION

## 2021-05-14 LAB — LIPID PANEL
Chol/HDL Ratio: 6 ratio — ABNORMAL HIGH (ref 0.0–4.4)
Cholesterol, Total: 250 mg/dL — ABNORMAL HIGH (ref 100–199)
HDL: 42 mg/dL (ref >39)
LDL Chol Calc (NIH): 182 mg/dL — ABNORMAL HIGH (ref 0–99)
Triglycerides: 142 mg/dL (ref 0–149)
VLDL Cholesterol Cal: 26 mg/dL (ref 5–40)

## 2021-05-14 LAB — HCV AB W REFLEX TO QUANT PCR: HCV Ab: NONREACTIVE

## 2021-05-14 LAB — HEMOGLOBIN A1C
Est. average glucose Bld gHb Est-mCnc: 111 mg/dL
Hgb A1c MFr Bld: 5.5 % (ref 4.8–5.6)

## 2021-05-15 NOTE — Addendum Note (Signed)
Addended by: Horton Chin on: 05/15/2021 07:23 PM ? ? Modules accepted: Orders ? ?

## 2021-05-19 ENCOUNTER — Other Ambulatory Visit: Payer: Self-pay | Admitting: Family Medicine

## 2021-05-19 DIAGNOSIS — E782 Mixed hyperlipidemia: Secondary | ICD-10-CM

## 2021-05-19 MED ORDER — ATORVASTATIN CALCIUM 40 MG PO TABS: 40.0000 mg | ORAL_TABLET | Freq: Every day | ORAL | 3 refills | Status: AC

## 2021-05-20 ENCOUNTER — Ambulatory Visit (INDEPENDENT_AMBULATORY_CARE_PROVIDER_SITE_OTHER): Payer: Medicare PPO

## 2021-05-20 ENCOUNTER — Telehealth: Payer: Self-pay

## 2021-05-20 DIAGNOSIS — Z0000 Encounter for general adult medical examination without abnormal findings: Secondary | ICD-10-CM

## 2021-05-20 NOTE — Patient Instructions (Signed)
Sandra Hernandez ?Thank you for taking time to come for your Medicare Wellness Visit. I appreciate your ongoing commitment to your health goals. Please review the following plan we discussed and let me know if I can assist you in the future.  ?  ?These are the goals we discussed: ? ? Goals   ? ?  Quit Smoking   ? ?  ? ?We also discussed recommended health maintenance. As discussed, you are up to date with everything.  ?Health Maintenance  ?Topic Date Due  ? COVID-19 Vaccine (4 - Booster for Moderna series) 05/29/2021 (Originally 12/16/2020)  ? Zoster Vaccines- Shingrix (1 of 2) 08/12/2021 (Originally 05/31/1995)  ? TETANUS/TDAP  05/14/2022 (Originally [birth year]30/1964)  ? INFLUENZA VACCINE  08/18/2021  ? COLONOSCOPY (Pts 45-1yrs Insurance coverage will need to be confirmed)  06/19/2026  ? Pneumonia Vaccine 40+ Years old  Completed  ? DEXA SCAN  Completed  ? Hepatitis C Screening  Completed  ? HPV VACCINES  Aged Out  ? ?CT Chest imaging scheduled for 5/16 at [facility]. ?FU with PCP in approx one year or as needed.  ?You can get your shingles vaccine at your local pharmacy.  ? ?We also discussed smoking cessation.  ?1-800-QUITNOW ? ?Preventive Care 72 Years and Older, Female ?Preventive care refers to lifestyle choices and visits with your health care provider that can promote health and wellness. Preventive care visits are also called wellness exams. ?What can I expect for my preventive care visit? ?Counseling ?Your health care provider may ask you questions about your: ?Medical history, including: ?Past medical problems. ?Family medical history. ?Pregnancy and menstrual history. ?History of falls. ?Current health, including: ?Memory and ability to understand (cognition). ?Emotional well-being. ?Home life and relationship well-being. ?Sexual activity and sexual health. ?Lifestyle, including: ?Alcohol, nicotine or tobacco, and drug use. ?Access to firearms. ?Diet, exercise, and sleep habits. ?Work and work  Statistician. ?Sunscreen use. ?Safety issues such as seatbelt and bike helmet use. ?Physical exam ?Your health care provider will check your: ?Height and weight. These may be used to calculate your BMI (body mass index). BMI is a measurement that tells if you are at a healthy weight. ?Waist circumference. This measures the distance around your waistline. This measurement also tells if you are at a healthy weight and may help predict your risk of certain diseases, such as type 2 diabetes and high blood pressure. ?Heart rate and blood pressure. ?Body temperature. ?Skin for abnormal spots. ?What immunizations do I need? ? ?Vaccines are usually given at various ages, according to a schedule. Your health care provider will recommend vaccines for you based on your age, medical history, and lifestyle or other factors, such as travel or where you work. ?What tests do I need? ?Screening ?Your health care provider may recommend screening tests for certain conditions. This may include: ?Lipid and cholesterol levels. ?Hepatitis C test. ?Hepatitis B test. ?HIV (human immunodeficiency virus) test. ?STI (sexually transmitted infection) testing, if you are at risk. ?Lung cancer screening. ?Colorectal cancer screening. ?Diabetes screening. This is done by checking your blood sugar (glucose) after you have not eaten for a while (fasting). ?Mammogram. Talk with your health care provider about how often you should have regular mammograms. ?BRCA-related cancer screening. This may be done if you have a family history of breast, ovarian, tubal, or peritoneal cancers. ?Bone density scan. This is done to screen for osteoporosis. ?Talk with your health care provider about your test results, treatment options, and if necessary, the need for more tests. ?  Follow these instructions at home: ?Eating and drinking ? ?Eat a diet that includes fresh fruits and vegetables, whole grains, lean protein, and low-fat dairy products. Limit your intake of  foods with high amounts of sugar, saturated fats, and salt. ?Take vitamin and mineral supplements as recommended by your health care provider. ?Do not drink alcohol if your health care provider tells you not to drink. ?If you drink alcohol: ?Limit how much you have to 0-1 drink a day. ?Know how much alcohol is in your drink. In the [place] one drink equals one 12 oz bottle of beer (355 mL), one 5 oz glass of wine (148 mL), or one 1? oz glass of hard liquor (44 mL). ?Lifestyle ?Brush your teeth every morning and night with fluoride toothpaste. Floss one time each day. ?Exercise for at least 30 minutes 5 or more days each week. ?Do not use any products that contain nicotine or tobacco. These products include cigarettes, chewing tobacco, and vaping devices, such as e-cigarettes. If you need help quitting, ask your health care provider. ?Do not use drugs. ?If you are sexually active, practice safe sex. Use a condom or other form of protection in order to prevent STIs. ?Take aspirin only as told by your health care provider. Make sure that you understand how much to take and what form to take. Work with your health care provider to find out whether it is safe and beneficial for you to take aspirin daily. ?Ask your health care provider if you need to take a cholesterol-lowering medicine (statin). ?Find healthy ways to manage stress, such as: ?Meditation, yoga, or listening to music. ?Journaling. ?Talking to a trusted person. ?Spending time with friends and family. ?Minimize exposure to UV radiation to reduce your risk of skin cancer. ?Safety ?Always wear your seat belt while driving or riding in a vehicle. ?Do not drive: ?If you have been drinking alcohol. Do not ride with someone who has been drinking. ?When you are tired or distracted. ?While texting. ?If you have been using any mind-altering substances or drugs. ?Wear a helmet and other protective equipment during sports activities. ?If you have firearms in your house,  make sure you follow all gun safety procedures. ?What's next? ?Visit your health care provider once a year for an annual wellness visit. ?Ask your health care provider how often you should have your eyes and teeth checked. ?Stay up to date on all vaccines. ?This information is not intended to replace advice given to you by your health care provider. Make sure you discuss any questions you have with your health care provider. ?Document Revised: 07/02/2020 Document Reviewed: 07/02/2020 ?Elsevier Patient Education ? [place]. ? ?Our clinic's number is [phone]. Please call with questions or concerns about what we discussed today.  ?

## 2021-05-20 NOTE — Progress Notes (Signed)
? ?Subjective:  ? Sandra Hernandez is a 76 y.o. female who presents for Medicare Annual (Subsequent) preventive examination. ? ?Patient consented to have virtual visit and was identified by name and date of birth. ?Method of visit: Telephone ? ?Encounter participants: ?Patient: Sandra Hernandez - located at Home ?Nurse/Provider: Dorna Bloom - located at [facility] ?Others (if applicable): Na ? ?Review of Systems: Defer to PCP. ? ?Cardiac Risk Factors include: advanced age (>69men, >28 women);hypertension;smoking/ tobacco exposure ? ?Objective:  ? ?Vitals: There were no vitals taken for this visit.  There is no height or weight on file to calculate BMI. ? ? ?  05/20/2021  ? 12:42 PM 05/13/2021  ?  2:22 PM 09/05/2019  ?  8:26 AM  ?Advanced Directives  ?Does Patient Have a Medical Advance Directive? No No No  ?Would patient like information on creating a medical advance directive? Yes (MAU/Ambulatory/Procedural Areas - Information given) No - Patient declined No - Patient declined  ? ?Tobacco ?Social History  ? ?Tobacco Use  ?Smoking Status Every Day  ? Packs/day: 0.33  ? Years: 10.00  ? Pack years: 3.30  ? Types: Cigarettes  ? Passive exposure: Current  ?Smokeless Tobacco Never  ?Tobacco Comments  ? 2-5 cigs per day  ?   ?Ready to quit: Yes ?Counseling given: Yes ?Tobacco comments: 2-5 cigs per day ? ?Clinical Intake: ? ?Pre-visit preparation completed: Yes ? ?Diabetes: No ? ?How often do you need to have someone help you when you read instructions, pamphlets, or other written materials from your doctor or pharmacy?: 2 - Rarely ?What is the last grade level you completed in school?: High School ? ?Interpreter Needed?: No ? ?Past Medical History:  ?Diagnosis Date  ? Allergy   ? seasonal  ? Clotting disorder ([place])   ? PE 2/2 OCPs in 1970s  ? Hyperlipidemia   ? Hypertension   ? Pulmonary emboli ([place])   ? 1970s while on OCPs. Treated in hospital and coumadin x 6 mo  ? ?Past Surgical History:  ?Procedure Laterality Date  ?  ABDOMINAL HYSTERECTOMY    ? partial 2001, full 2013  ? BREAST BIOPSY  2016  ? benign  ? ?Family History  ?Problem Relation Age of Onset  ? Leukemia Mother   ? Prostate cancer Father   ? Microcephaly Sister   ? Fibroids Daughter   ? Cancer Neg Hx   ? ?Social History  ? ?Socioeconomic History  ? Marital status: Divorced  ?  Spouse name: Not on file  ? Number of children: 1  ? Years of education: 33  ? Highest education level: High school graduate  ?Occupational History  ? Occupation: retired  ?Tobacco Use  ? Smoking status: Every Day  ?  Packs/day: 0.33  ?  Years: 10.00  ?  Pack years: 3.30  ?  Types: Cigarettes  ?  Passive exposure: Current  ? Smokeless tobacco: Never  ? Tobacco comments:  ?  2-5 cigs per day  ?Vaping Use  ? Vaping Use: Never used  ?Substance and Sexual Activity  ? Alcohol use: Never  ? Drug use: Never  ? Sexual activity: Yes  ?  Birth control/protection: Post-menopausal  ?Other Topics Concern  ? Not on file  ?Social History Narrative  ? Patient moved from [place] in 2020.  ? Patient is living with her fiance.   ? Patient has one daughter located in [place]. They are close.   ? Patient has 2 dogs, a [place] and a  jack russel.  ? Patient walks 3x per week for exercise.   ? ?Social Determinants of Health  ? ?Financial Resource Strain: Low Risk   ? Difficulty of Paying Living Expenses: Not hard at all  ?Food Insecurity: No Food Insecurity  ? Worried About Charity fundraiser in the Last Year: Never true  ? Ran Out of Food in the Last Year: Never true  ?Transportation Needs: No Transportation Needs  ? Lack of Transportation (Medical): No  ? Lack of Transportation (Non-Medical): No  ?Physical Activity: Insufficiently Active  ? Days of Exercise per Week: 3 days  ? Minutes of Exercise per Session: 30 min  ?Stress: No Stress Concern Present  ? Feeling of Stress : Not at all  ?Social Connections: Moderately Isolated  ? Frequency of Communication with Friends and Family: More than three times a week   ? Frequency of Social Gatherings with Friends and Family: More than three times a week  ? Attends Religious Services: Never  ? Active Member of Clubs or Organizations: No  ? Attends Archivist Meetings: Never  ? Marital Status: Living with partner  ? ?Outpatient Encounter Medications as of 05/20/2021  ?Medication Sig  ? amLODipine (NORVASC) 5 MG tablet Take 1 tablet (5 mg total) by mouth at bedtime.  ? atorvastatin (LIPITOR) 40 MG tablet Take 1 tablet (40 mg total) by mouth daily.  ? hydrOXYzine (ATARAX) 25 MG tablet TAKE 1 TABLET BY MOUTH AT BEDTIME AS NEEDED FOR ITCHING  ? ?No facility-administered encounter medications on file as of 05/20/2021.  ? ?Activities of Daily Living ? ?  05/20/2021  ? 12:42 PM  ?In your present state of health, do you have any difficulty performing the following activities:  ?Hearing? 0  ?Vision? 0  ?Difficulty concentrating or making decisions? 0  ?Walking or climbing stairs? 1  ?Dressing or bathing? 0  ?Doing errands, shopping? 0  ?Preparing Food and eating ? N  ?Using the Toilet? N  ?In the past six months, have you accidently leaked urine? N  ?Do you have problems with loss of bowel control? N  ?Managing your Medications? N  ?Managing your Finances? N  ?Housekeeping or managing your Housekeeping? N  ? ?Patient Care Team: ?Gladys Damme, MD as PCP - General ?   ?Assessment:  ? This is a routine wellness examination for [facility]. ? ?Exercise Activities and Dietary recommendations ?Current Exercise Habits: Home exercise routine, Type of exercise: walking, Time (Minutes): 30, Frequency (Times/Week): 3, Weekly Exercise (Minutes/Week): 90, Intensity: Mild, Exercise limited by: cardiac condition(s) ? ? Goals   ? ?  Quit Smoking   ? ?  ? ?Fall Risk ? ?  05/20/2021  ? 12:42 PM 05/13/2021  ?  2:20 PM 09/05/2019  ?  8:26 AM 01/25/2019  ? 10:07 AM 08/17/2018  ?  2:43 PM  ?Fall Risk   ?Falls in the past year? 0 0 0 0 0  ?Number falls in past yr: 0 0 0 0   ?Injury with Fall? 0 0 0    ?Risk for fall  due to : No Fall Risks      ?Follow up    Falls evaluation completed   ? ?Patient denies hx of falls. Patient reports normal gait and balance. Patient does not use assistive devices to ambulate.  ? ?Is the patient's home free of loose throw rugs in walkways, pet beds, electrical cords, etc?   yes ?     Grab bars in the bathroom? yes ?  Handrails on the stairs?   yes ?     Adequate lighting?   yes ? ?Patient rating of health (0-10) scale: 10  ? ?Depression Screen ? ?  05/20/2021  ? 12:40 PM 05/13/2021  ?  2:20 PM 09/05/2019  ?  8:26 AM 01/25/2019  ? 10:07 AM  ?PHQ 2/9 Scores  ?PHQ - 2 Score 0 0 0 0  ?PHQ- 9 Score 0 0 0   ?  ?Cognitive Function ? ?  05/20/2021  ? 12:43 PM  ?6CIT Screen  ?What Year? 0 points  ?What month? 0 points  ?What time? 0 points  ?Count back from 20 0 points  ?Months in reverse 0 points  ?Repeat phrase 0 points  ?Total Score 0 points  ? ?Immunization History  ?Administered Date(s) Administered  ? Influenza,inj,Quad PF,6+ Mos 11/15/2018  ? Moderna Sars-Covid-2 Vaccination 03/02/2019, 03/30/2019, 10/21/2020  ? Pneumococcal Conjugate-13 04/17/2013  ? Pneumococcal Polysaccharide-23 08/13/2016  ? ?Qualifies for Shingles Vaccine? Yes   ?Shingrix Completed: No, Education has been provided regarding the importance of this vaccine. Advised may receive this vaccine at local pharmacy or Health Dept. Aware to provide a copy of the vaccination record if obtained from local pharmacy or Health Dept. Verbalized acceptance and understanding. ? ?Screening Tests ?Health Maintenance  ?Topic Date Due  ? COVID-19 Vaccine (4 - Booster for Moderna series) 05/29/2021 (Originally 12/16/2020)  ? Zoster Vaccines- Shingrix (1 of 2) 08/12/2021 (Originally 05/31/1995)  ? TETANUS/TDAP  05/14/2022 (Originally 05/30/1964)  ? INFLUENZA VACCINE  08/18/2021  ? COLONOSCOPY (Pts 45-25yrs Insurance coverage will need to be confirmed)  06/19/2026  ? Pneumonia Vaccine 65+ Years old  Completed  ? DEXA SCAN  Completed  ? Hepatitis C Screening   Completed  ? HPV VACCINES  Aged Out  ? ?Cancer Screenings: ?Lung: Low Dose CT Chest recommended if Age 54-80 years, 20 pack-year currently smoking OR have quit w/in 15years. Patient does qualify. CT Chest s

## 2021-05-20 NOTE — Telephone Encounter (Signed)
Patient called back. I gave her the message. ? ?Thanks! ?

## 2021-05-20 NOTE — Telephone Encounter (Signed)
Attempted to reach patient to inform of CT scan on May 16th at [facility] at 1:30. No answer. Unable to LVM due to it being full. Will try later. Aquilla Solian, CMA ? ?

## 2021-05-20 NOTE — Progress Notes (Signed)
I have reviewed this visit and agree with the documentation.  Tiburcio Linder, MD  Family Medicine Teaching Service   

## 2021-06-02 ENCOUNTER — Ambulatory Visit (HOSPITAL_COMMUNITY)
Admission: RE | Admit: 2021-06-02 | Discharge: 2021-06-02 | Disposition: A | Discharge status: Home / Self Care | Payer: Medicare PPO | Source: Ambulatory Visit | Attending: Family Medicine | Admitting: Family Medicine

## 2021-06-02 DIAGNOSIS — F17210 Nicotine dependence, cigarettes, uncomplicated: Secondary | ICD-10-CM | POA: Insufficient documentation

## 2021-06-02 DIAGNOSIS — Z122 Encounter for screening for malignant neoplasm of respiratory organs: Secondary | ICD-10-CM | POA: Diagnosis not present

## 2021-06-02 DIAGNOSIS — F17200 Nicotine dependence, unspecified, uncomplicated: Secondary | ICD-10-CM | POA: Insufficient documentation

## 2021-06-18 ENCOUNTER — Other Ambulatory Visit: Payer: Self-pay

## 2021-06-18 DIAGNOSIS — L299 Pruritus, unspecified: Secondary | ICD-10-CM

## 2021-06-19 MED ORDER — HYDROXYZINE HCL 25 MG PO TABS: ORAL_TABLET | ORAL | 0 refills | Status: DC

## 2021-06-23 ENCOUNTER — Encounter: Payer: Self-pay | Admitting: *Deleted

## 2021-08-05 ENCOUNTER — Other Ambulatory Visit: Payer: Self-pay | Admitting: Family Medicine

## 2021-08-05 DIAGNOSIS — L299 Pruritus, unspecified: Secondary | ICD-10-CM

## 2021-08-31 ENCOUNTER — Other Ambulatory Visit: Payer: Self-pay | Admitting: Family Medicine

## 2021-08-31 DIAGNOSIS — L299 Pruritus, unspecified: Secondary | ICD-10-CM

## 2021-09-07 ENCOUNTER — Other Ambulatory Visit: Payer: Self-pay

## 2021-09-07 DIAGNOSIS — L299 Pruritus, unspecified: Secondary | ICD-10-CM

## 2021-09-07 NOTE — Telephone Encounter (Signed)
Patient calls nurse line in regards to hydroxyzine prescription.   Patients last fill was 7/20.   Patient requesting another refill as she takes one at bedtime.   Please send to [facility] in [place].

## 2021-09-10 MED ORDER — HYDROXYZINE HCL 25 MG PO TABS: ORAL_TABLET | ORAL | 0 refills | Status: AC

## 2021-09-10 NOTE — Telephone Encounter (Signed)
Patient LVM on nurse line regarding prescription refill.   Attempted to return call to patient. She did not answer, LVM asking patient to return call to office. She will need to schedule appointment for additional refills per Dr. Velna Ochs.   Veronda Prude, RN

## 2021-09-10 NOTE — Addendum Note (Signed)
Addended by: Celine Mans on: 09/10/2021 05:53 PM   Modules accepted: Orders

## 2021-09-10 NOTE — Telephone Encounter (Signed)
Patient returns call to nurse line regarding rx refill. Advised that she needed to schedule an appointment. Scheduled for PCP's next available appointment on 9/7.  Please advise if partial refill can be sent until this appointment.  Veronda Prude, RN

## 2021-09-24 ENCOUNTER — Ambulatory Visit: Payer: Medicare PPO | Admitting: Family Medicine

## 2021-10-07 DIAGNOSIS — Z136 Encounter for screening for cardiovascular disorders: Secondary | ICD-10-CM | POA: Diagnosis not present

## 2021-10-07 DIAGNOSIS — Z23 Encounter for immunization: Secondary | ICD-10-CM | POA: Diagnosis not present

## 2021-10-07 DIAGNOSIS — Z0130 Encounter for examination of blood pressure without abnormal findings: Secondary | ICD-10-CM | POA: Diagnosis not present

## 2021-10-07 DIAGNOSIS — F17210 Nicotine dependence, cigarettes, uncomplicated: Secondary | ICD-10-CM | POA: Diagnosis not present

## 2021-10-07 DIAGNOSIS — R7303 Prediabetes: Secondary | ICD-10-CM | POA: Diagnosis not present

## 2021-10-07 DIAGNOSIS — I10 Essential (primary) hypertension: Secondary | ICD-10-CM | POA: Diagnosis not present

## 2021-10-07 DIAGNOSIS — L299 Pruritus, unspecified: Secondary | ICD-10-CM | POA: Diagnosis not present

## 2021-11-04 DIAGNOSIS — Z0130 Encounter for examination of blood pressure without abnormal findings: Secondary | ICD-10-CM | POA: Diagnosis not present

## 2021-11-04 DIAGNOSIS — F17210 Nicotine dependence, cigarettes, uncomplicated: Secondary | ICD-10-CM | POA: Diagnosis not present

## 2021-11-04 DIAGNOSIS — Z6829 Body mass index (BMI) 29.0-29.9, adult: Secondary | ICD-10-CM | POA: Diagnosis not present

## 2021-11-04 DIAGNOSIS — Z712 Person consulting for explanation of examination or test findings: Secondary | ICD-10-CM | POA: Diagnosis not present

## 2022-03-11 DIAGNOSIS — Z981 Arthrodesis status: Secondary | ICD-10-CM | POA: Diagnosis not present

## 2022-03-11 DIAGNOSIS — M47816 Spondylosis without myelopathy or radiculopathy, lumbar region: Secondary | ICD-10-CM | POA: Diagnosis not present

## 2022-04-02 DIAGNOSIS — Z1231 Encounter for screening mammogram for malignant neoplasm of breast: Secondary | ICD-10-CM | POA: Diagnosis not present

## 2022-05-16 IMAGING — MG MM DIGITAL SCREENING BILAT W/ TOMO AND CAD
6 of 10 series · 6 of 30 positions shown · non-contrast
Comparison: Previous exam(s).

CLINICAL DATA: Screening.

EXAM:
DIGITAL SCREENING BILATERAL MAMMOGRAM WITH TOMOSYNTHESIS AND CAD
TECHNIQUE: Bilateral screening digital craniocaudal and mediolateral oblique
mammograms were obtained. Bilateral screening digital breast
tomosynthesis was performed. The images were evaluated with
computer-aided detection.

[L MLO synth-2D (1 of 2)]
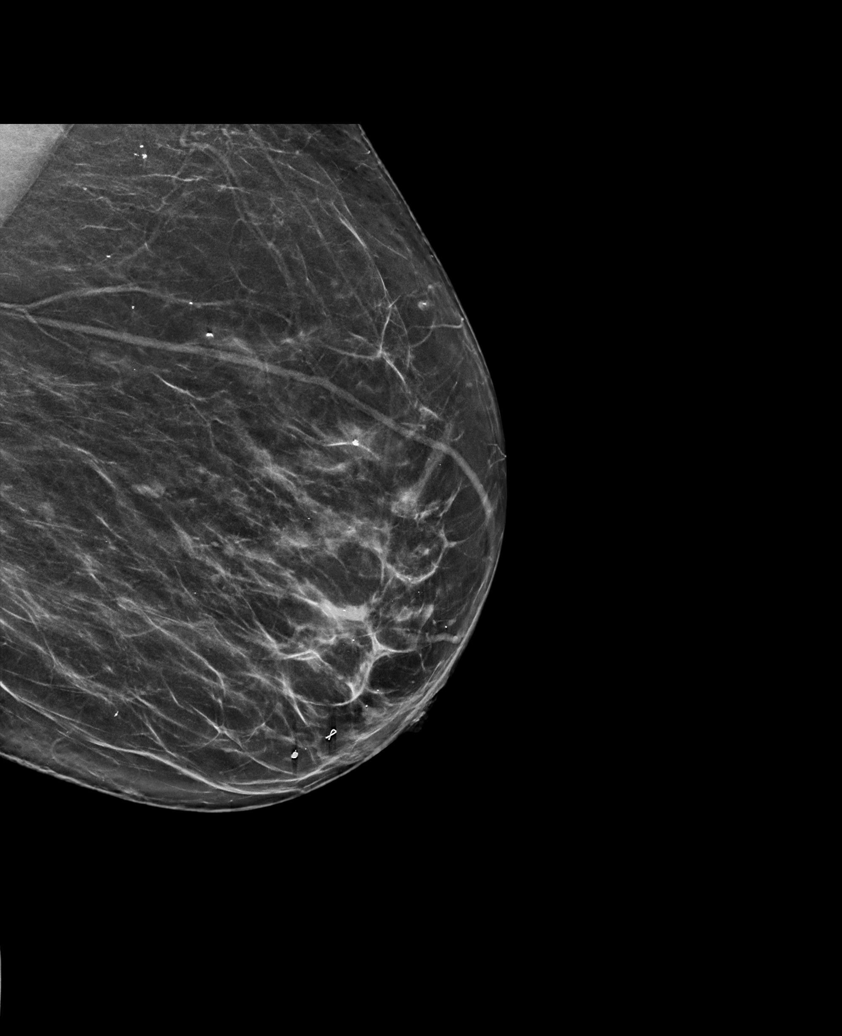

[R CC synth-2D]
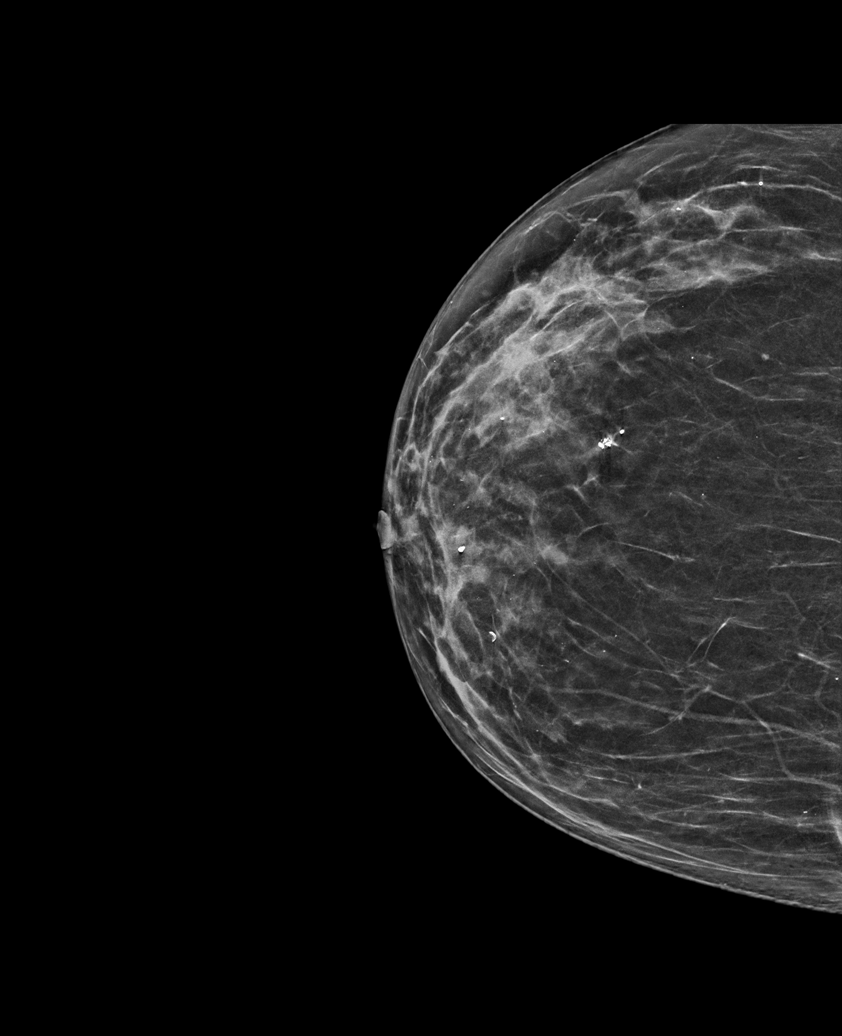

[L MLO synth-2D (2 of 2)]
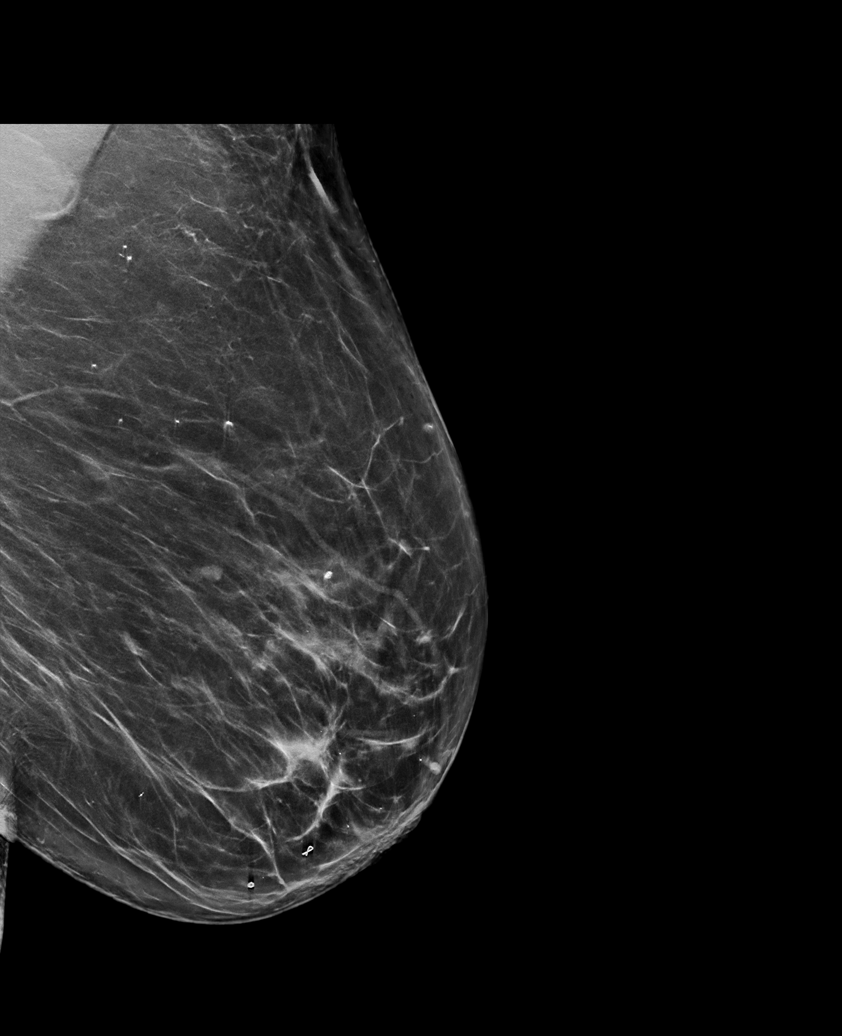

[L CC synth-2D]
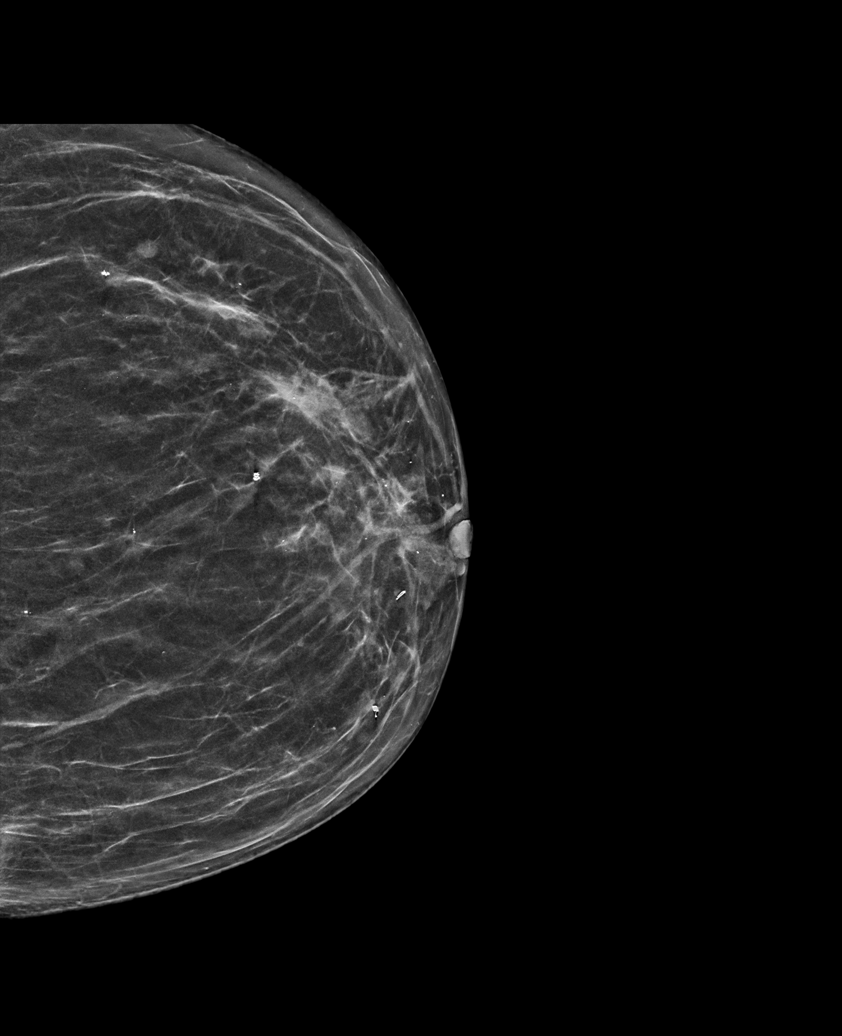

[R MLO synth-2D]
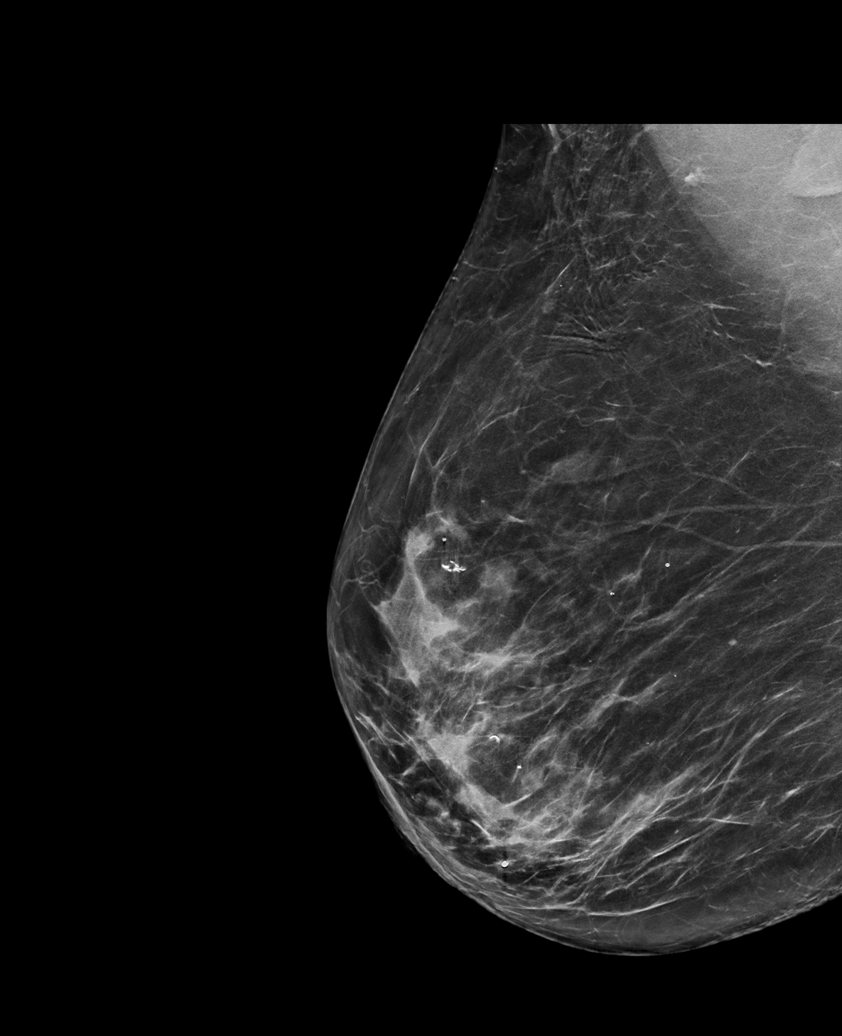

[L CC tomo · tomo slice 29/58.0]
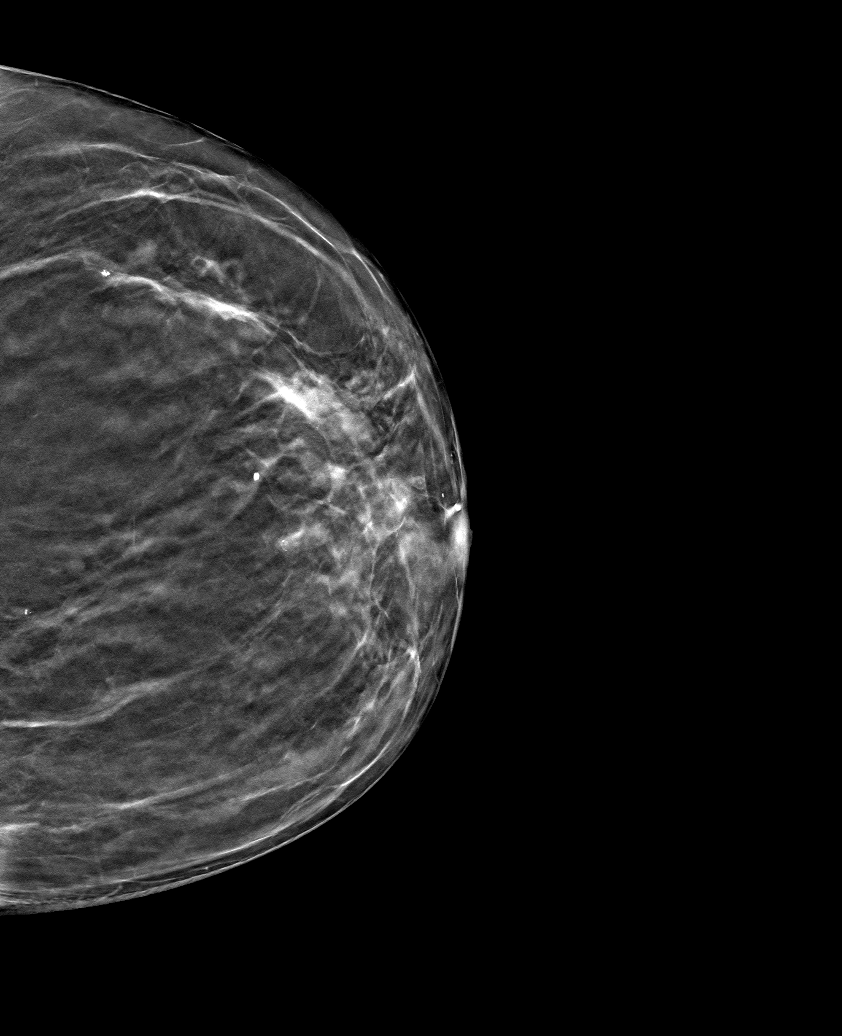

[6 of 30 positions shown; findings below may reference images not displayed]

ACR Breast Density Category b: There are scattered areas of
fibroglandular density.
FINDINGS: There are no findings suspicious for malignancy.
IMPRESSION: No mammographic evidence of malignancy. A result letter of this
screening mammogram will be mailed directly to the patient.

RECOMMENDATION:
Screening mammogram in one year. (Code:51-O-LD2)

BI-RADS CATEGORY  1: Negative.

## 2022-07-23 ENCOUNTER — Telehealth: Payer: Self-pay | Admitting: Family Medicine

## 2022-07-23 NOTE — Telephone Encounter (Signed)
Contacted [facility] Nurse to schedule their annual wellness visit. Patient declined to schedule AWV at this time.  Patient has moved. She has a new [employer].  Thank you,  [facility] Support [facility] Direct dial  [phone]
